# Patient Record
Sex: Male | Born: 1950 | Race: White | Hispanic: No | Marital: Married | Smoking: Never smoker
Health system: Southern US, Community
[De-identification: ages and names within clinical notes are randomized; demographics above are authoritative.]

## PROBLEM LIST (undated history)

## (undated) DIAGNOSIS — E119 Type 2 diabetes mellitus without complications: Secondary | ICD-10-CM

## (undated) DIAGNOSIS — G4733 Obstructive sleep apnea (adult) (pediatric): Secondary | ICD-10-CM

## (undated) DIAGNOSIS — C7B00 Secondary carcinoid tumors, unspecified site: Secondary | ICD-10-CM

## (undated) DIAGNOSIS — C787 Secondary malignant neoplasm of liver and intrahepatic bile duct: Secondary | ICD-10-CM

## (undated) DIAGNOSIS — I639 Cerebral infarction, unspecified: Secondary | ICD-10-CM

## (undated) DIAGNOSIS — I10 Essential (primary) hypertension: Secondary | ICD-10-CM

## (undated) HISTORY — PX: CHOLECYSTECTOMY: SHX55

---

## 2012-07-13 DIAGNOSIS — E785 Hyperlipidemia, unspecified: Secondary | ICD-10-CM | POA: Insufficient documentation

## 2012-07-13 DIAGNOSIS — I639 Cerebral infarction, unspecified: Secondary | ICD-10-CM | POA: Insufficient documentation

## 2014-08-07 DIAGNOSIS — D3A098 Benign carcinoid tumors of other sites: Secondary | ICD-10-CM | POA: Insufficient documentation

## 2014-10-17 ENCOUNTER — Other Ambulatory Visit: Payer: Self-pay | Admitting: Hematology and Oncology

## 2014-10-17 DIAGNOSIS — C228 Malignant neoplasm of liver, primary, unspecified as to type: Secondary | ICD-10-CM

## 2014-10-23 ENCOUNTER — Other Ambulatory Visit: Payer: Self-pay | Admitting: Hematology and Oncology

## 2014-10-23 DIAGNOSIS — C228 Malignant neoplasm of liver, primary, unspecified as to type: Secondary | ICD-10-CM

## 2014-10-23 DIAGNOSIS — C7A8 Other malignant neuroendocrine tumors: Secondary | ICD-10-CM

## 2014-10-24 ENCOUNTER — Ambulatory Visit
Admission: RE | Admit: 2014-10-24 | Discharge: 2014-10-24 | Disposition: A | Discharge status: Home / Self Care | Payer: Medicare Other | Source: Ambulatory Visit | Attending: Hematology and Oncology | Admitting: Hematology and Oncology

## 2014-10-24 DIAGNOSIS — C228 Malignant neoplasm of liver, primary, unspecified as to type: Secondary | ICD-10-CM | POA: Insufficient documentation

## 2014-10-24 DIAGNOSIS — C7A8 Other malignant neuroendocrine tumors: Secondary | ICD-10-CM | POA: Insufficient documentation

## 2014-10-24 HISTORY — DX: Secondary malignant neoplasm of liver and intrahepatic bile duct: C78.7

## 2014-10-24 HISTORY — DX: Essential (primary) hypertension: I10

## 2014-10-24 HISTORY — DX: Cerebral infarction, unspecified: I63.9

## 2014-10-24 NOTE — Consult Note (Signed)
Chief Complaint: Patient was seen in consultation today for  Chief Complaint  Patient presents with  . Advice Only    Liver metastasis/metastatic neuroendocrine carcinoid   at the request of Sanders,George H.  Referring Physician(s): Sanders,George H.  History of Present Illness: Gregory Cook is a 64 y.o. 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 His initial disease includes a solitary 12 mm right pulmonary nodule and a multifocal lesions in both the left and right liver.    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 This is currently relatively well controlled. He does describe intermittent flushing and shortness of breath but this does not seem to be as significant. He reports dyspnea on exertion and a constant feeling of pressure in his chest. He has undergone extensive workup for this including pulmonology and cardiology consultations. They have found no issues with his lungs or heart despite extensive workup.  Both 5 HIAA and ACTH levels are high. ACTH is 72.  Renal function and hepatic reserve are normal.  His most recent MRI demonstrates progression of hepatic metastatic disease.  There are 4 dominant liver lesions which are all enlarged. An additional 4 small lesions are also noted. His right upper lobe pulmonary nodule remains stable.   Past Medical History  Diagnosis Date  . Liver metastasis   . Stroke   . Hypertension     No past surgical history on file.  Allergies: Nuvigil  Medications: Prior to Admission medications   Medication Sig Start Date End Date Taking? Authorizing Provider  aspirin 81 MG tablet Take 81 mg by mouth daily.   Yes Historical Provider, MD  atenolol (TENORMIN) 50 MG tablet Take 50 mg by mouth daily.   Yes Historical Provider, MD    butalbital-acetaminophen-caffeine (FIORICET, ESGIC) 50-325-40 MG per tablet Take 1 tablet by mouth every 4 (four) hours as needed for headache.   Yes Historical Provider, MD  celecoxib (CELEBREX) 200 MG capsule Take 200 mg by mouth 2 (two) times daily.   Yes Historical Provider, MD  cholestyramine light (PREVALITE) 4 GM/DOSE powder Take 4 g by mouth 2 (two) times daily with a meal.   Yes Historical Provider, MD  diltiazem (DILACOR XR) 120 MG 24 hr capsule Take 120 mg by mouth daily.   Yes Historical Provider, MD  diphenoxylate-atropine (LOMOTIL) 2.5-0.025 MG per tablet Take 1 tablet by mouth 2 (two) times daily.   Yes Historical Provider, MD  famotidine (PEPCID) 10 MG tablet Take 10 mg by mouth 2 (two) times daily.   Yes Historical Provider, MD  HYDROcodone-acetaminophen (NORCO/VICODIN) 5-325 MG per tablet Take 1 tablet by mouth every 6 (six) hours as needed for moderate pain.   Yes Historical Provider, MD  octreotide (SANDOSTATIN LAR) 30 MG injection Inject 30 mg into the muscle every 28 (twenty-eight) days.   Yes Historical Provider, MD  Probiotic Product (PROBIOTIC DAILY PO) Take 1 capsule by mouth.   Yes Historical Provider, MD  rOPINIRole (REQUIP) 1 MG tablet Take 1 mg by mouth at bedtime.   Yes Historical Provider, MD  temazepam (RESTORIL) 15 MG capsule Take 15 mg by mouth at bedtime as needed for sleep.   Yes Historical Provider, MD  terazosin (HYTRIN) 10 MG capsule Take 10 mg by mouth at bedtime.   Yes Historical Provider, MD  testosterone cypionate (DEPOTESTOTERONE CYPIONATE) 100  MG/ML injection Inject 200 mg into the muscle every 14 (fourteen) days. For IM use only   Yes Historical Provider, MD  triamterene-hydrochlorothiazide (MAXZIDE) 75-50 MG per tablet Take 1 tablet by mouth daily.   Yes Historical Provider, MD     No family history on file.  History   Social History  . Marital Status: Married    Spouse Name: N/A  . Number of Children: N/A  . Years of Education: N/A   Social  History Main Topics  . Smoking status: Never Smoker   . Smokeless tobacco: Never Used  . Alcohol Use: No  . Drug Use: No  . Sexual Activity: Not on file   Other Topics Concern  . Not on file   Social History Narrative  . No narrative on file    ECOG Status: 1 - Symptomatic but completely ambulatory  Review of Systems: A 12 point ROS discussed and pertinent positives are indicated in the HPI above.  All other systems are negative.  Review of Systems  Vital Signs: BP 131/65 mmHg  Pulse 459  Temp(Src) 97.8 F (36.6 C) (Oral)  Resp 12  Ht 5\' 10"  (1.778 m)  Wt 272 lb (123.378 kg)  BMI 39.03 kg/m2  SpO2 98%  Physical Exam  Constitutional: He is oriented to person, place, and time. He appears well-developed and well-nourished. No distress.  HENT:  Head: Normocephalic and atraumatic.  Eyes: No scleral icterus.  Cardiovascular: Normal rate and regular rhythm.   Pulmonary/Chest: Effort normal.  Abdominal: Soft. He exhibits no distension and no mass. There is no tenderness.  Neurological: He is alert and oriented to person, place, and time.  Skin: Skin is warm and dry.  Psychiatric: He has a normal mood and affect. His behavior is normal.  Nursing note and vitals reviewed.   Mallampati Score:  2 Imaging: No results found.  Labs:  CBC: No results for input(s): WBC, HGB, HCT, PLT in the last 8760 hours.  COAGS: No results for input(s): INR, APTT in the last 8760 hours.  BMP: No results for input(s): NA, K, CL, CO2, GLUCOSE, BUN, CALCIUM, CREATININE, GFRNONAA, GFRAA in the last 8760 hours.  Invalid input(s): CMP  LIVER FUNCTION TESTS: No results for input(s): BILITOT, AST, ALT, ALKPHOS, PROT, ALBUMIN in the last 8760 hours.  TUMOR MARKERS: No results for input(s): AFPTM, CEA, CA199, CHROMGRNA in the last 8760 hours.  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 His solitary pulmonary nodule has remained  stable for the past 2 years while his hepatic disease has demonstrated progression on imaging surveillance despite Sandostatin therapy.  He has mild intermittent symptoms of carcinoid syndrome currently maintained on simvastatin 30 mg intramuscularly injected monthly.  There are 4 dominant lesions in the liver involving segments 4b, and 8/6. An additional 4 smaller lesions are present in segments 1, 3 and 6.  This gentleman is a candidate for liver directed therapy with bland embolization. Bland embolization has been shown to be equivalent to chemoembolization for the treatment of metastatic neuroendocrine tumor. If his disease progresses following bland embolization, the next step would be radioembolization with Y90.  90 radioembolization is also preferable in the setting of more significant multifocal disease.    Due to the overall number of lesions, he is not a good candidate for percutaneous thermal ablation.  The risks, benefits and alternatives to bland embolization were discussed at length with Mr. Kiernan and his wife.  He understands that he  may need several embolization sessions to cover all lesions and establish response to therapy. He also understands that if there is disease progression over the coming months or years, upgrade to radioembolization with Y90 may be required. They voiced their understanding and desire to proceed with this treatment.  1.) Will schedule for bland embolization to be performed at either [facility] or [facility] (first available for my schedule).  Mr. 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  2.) He will require pretreatment to prevent carcinoid crisis. The patient will receive 250 g Sandostatin subcutaneously on the day of the procedure. Additionally, another 500 g Sandostatin and 50 mL normal saline should be available during the procedure to be used as needed. He  will be admitted overnight for observation prior to discharge.  3.) We will keep an eye on his right pulmonary nodule. If this demonstrates any growth over time, this lesion would be easily amenable to percutaneous microwave ablation.  Thank you for this interesting consult.  I greatly enjoyed meeting Gregory Cook and look forward to participating in their care.  A copy of this report was sent to the requesting provider on this date.  SignedLaurence Ferrari, [place] 10/24/2014, 1:10 PM   I spent a total of  40 Minutes  in face to face in clinical consultation, greater than 50% of which was counseling/coordinating care for metastatic carcinoid tumor.

## 2014-11-06 ENCOUNTER — Other Ambulatory Visit: Payer: Self-pay | Admitting: Interventional Radiology

## 2014-11-06 ENCOUNTER — Other Ambulatory Visit: Payer: Self-pay | Admitting: *Deleted

## 2014-11-06 DIAGNOSIS — C7B8 Other secondary neuroendocrine tumors: Principal | ICD-10-CM

## 2014-11-06 DIAGNOSIS — C7A8 Other malignant neuroendocrine tumors: Secondary | ICD-10-CM

## 2014-11-23 ENCOUNTER — Ambulatory Visit
Admission: RE | Admit: 2014-11-23 | Discharge: 2014-11-23 | Disposition: A | Discharge status: Home / Self Care | Payer: Medicare Other | Source: Ambulatory Visit | Attending: Interventional Radiology | Admitting: Interventional Radiology

## 2014-11-23 DIAGNOSIS — C7B8 Other secondary neuroendocrine tumors: Secondary | ICD-10-CM | POA: Insufficient documentation

## 2014-11-23 NOTE — Progress Notes (Signed)
Chief Complaint: Patient was seen in consultation today for post bland embolization  Chief Complaint  Patient presents with  . Follow-up     3wk follow up Gregory Cook    at the request of Dr Gregory Cook  Referring Physician(s): Cook,Gregory  History of Present Illness:  Gregory Cook is a 64 y.o. 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 His initial disease includes a solitary 12 mm right pulmonary nodule and a multifocal lesions in both the left and right liver.   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  Bland Embolization of hepatic segment 4 was performed 11/03/14 at [facility] Overnight stay was uneventful and discharged following day. He comes today to clinic for follow up with Dr Gregory Cook. Pt states he did have expected moderate abdominal pain for approx 1 week following procedure then mild pain for additional week. Abdominal pain now is less than minimal ; occurring daily , lasting 1-2 hrs and resolves on own. Pain location is epigastric to Rt upper quadrant. Uses occasional Tylenol if anything at all. Diarrhea has really subsided quite a bit. Has noted that he now only has occassional bout and even then it is mild. Does take 1 Lomotil daily and only rarely uses Cholestyramine powder as needed. Has been able to return to most all daily activities  Labs drawn 11/13/14: all wnl No change in LFTs Sl increased wbc: 11 Cr wnl Glucose 200   Past Medical History  Diagnosis Date  . Liver metastasis   . Stroke   . Hypertension     No past surgical history on file.  Allergies: Nuvigil  Medications: Prior to Admission medications   Medication Sig Start Date End Date Taking? Authorizing Provider  aspirin 81 MG tablet Take 81 mg by mouth daily.   Yes  Historical Provider, MD  atenolol (TENORMIN) 50 MG tablet Take 50 mg by mouth daily.   Yes Historical Provider, MD  butalbital-acetaminophen-caffeine (FIORICET, ESGIC) 50-325-40 MG per tablet Take 1 tablet by mouth every 4 (four) hours as needed for headache.   Yes Historical Provider, MD  celecoxib (CELEBREX) 200 MG capsule Take 200 mg by mouth 2 (two) times daily.   Yes Historical Provider, MD  cholestyramine light (PREVALITE) 4 GM/DOSE powder Take 4 g by mouth 2 (two) times daily with a meal.   Yes Historical Provider, MD  diltiazem (DILACOR XR) 120 MG 24 hr capsule Take 120 mg by mouth daily.   Yes Historical Provider, MD  diphenoxylate-atropine (LOMOTIL) 2.5-0.025 MG per tablet Take 1 tablet by mouth 2 (two) times daily.   Yes Historical Provider, MD  famotidine (PEPCID) 10 MG tablet Take 10 mg by mouth 2 (two) times daily.   Yes Historical Provider, MD  HYDROcodone-acetaminophen (NORCO/VICODIN) 5-325 MG per tablet Take 1 tablet by mouth every 6 (six) hours as needed for moderate pain.   Yes Historical Provider, MD  octreotide (SANDOSTATIN LAR) 30 MG injection Inject 30 mg into the muscle every 28 (twenty-eight) days.   Yes Historical Provider, MD  Probiotic Product (PROBIOTIC DAILY PO) Take 1 capsule by mouth.   Yes Historical Provider, MD  rOPINIRole (REQUIP) 1 MG tablet Take 1 mg by mouth at bedtime.   Yes Historical Provider, MD  terazosin (HYTRIN) 10 MG capsule Take 10 mg by mouth at bedtime.   Yes Historical Provider, MD  testosterone cypionate (DEPOTESTOTERONE CYPIONATE) 100 MG/ML injection Inject 200 mg into the muscle every 14 (fourteen) days. For IM use only   Yes Historical Provider, MD  triamterene-hydrochlorothiazide (MAXZIDE) 75-50 MG per tablet Take 1 tablet by mouth daily.   Yes Historical Provider, MD  temazepam (RESTORIL) 15 MG capsule Take 15 mg by mouth at bedtime as needed for sleep.    Historical Provider, MD     No family history on file.  Social History   Social  History  . Marital Status: Married    Spouse Name: N/A  . Number of Children: N/A  . Years of Education: N/A   Social History Main Topics  . Smoking status: Never Smoker   . Smokeless tobacco: Never Used  . Alcohol Use: No  . Drug Use: No  . Sexual Activity: Not on file   Other Topics Concern  . Not on file   Social History Narrative  . No narrative on file    Review of Systems: A 12 point ROS discussed and pertinent positives are indicated in the HPI above.  All other systems are negative.  Review of Systems  Constitutional: Negative for activity change, appetite change and fatigue.  Gastrointestinal: Positive for abdominal pain and diarrhea.  Neurological: Negative for weakness.  Psychiatric/Behavioral: Negative for behavioral problems and confusion.    Vital Signs: BP 135/71 mmHg  Pulse 61  Temp(Src) 98.1 F (36.7 C) (Oral)  Resp 14  SpO2 99%  Physical Exam  Constitutional: He is oriented to person, place, and time. He appears well-nourished.  Cardiovascular: Normal rate, regular rhythm and normal heart sounds.   No murmur heard. Pulmonary/Chest: Effort normal and breath sounds normal. He has no wheezes.  Abdominal: Soft. Bowel sounds are normal. There is tenderness.  Mild RUQ to mid epigastric pain to deep palpation  Musculoskeletal: Normal range of motion.  Neurological: He is alert and oriented to person, place, and time.  Skin: Skin is warm and dry.  Psychiatric: He has a normal mood and affect. His behavior is normal. Judgment and thought content normal.  Nursing note and vitals reviewed.   Mallampati Score:     Imaging: No results found.  Labs:  CBC: No results for input(s): WBC, HGB, HCT, PLT in the last 8760 hours.  COAGS: No results for input(s): INR, APTT in the last 8760 hours.  BMP: No results for input(s): NA, K, CL, CO2, GLUCOSE, BUN, CALCIUM, CREATININE, GFRNONAA, GFRAA in the last 8760 hours.  Invalid input(s): CMP  LIVER  FUNCTION TESTS: No results for input(s): BILITOT, AST, ALT, ALKPHOS, PROT, ALBUMIN in the last 8760 hours.  TUMOR MARKERS: No results for input(s): AFPTM, CEA, CA199, CHROMGRNA in the last 8760 hours.  Assessment:  Neuroendocrine cancer Metastasis to liver L hepatic segment 4 bland embolization performed 11/03/14 in [facility] with Dr Gregory Cook Imaging of procedure has been reviewed with pt and wife per Dr Gregory Cook All questions answered to satisfaction Has done well at follow up Plan: Rt sided bland embolization scheduled at [place] 12/07/14 with Dr Gregory Cook Pt and wife agreeable to plan.   SignedMonia Sabal A 11/23/2014, 8:54 AM   Please refer to Dr. Laurence Cook attestation of this note for management and plan.

## 2014-12-08 ENCOUNTER — Other Ambulatory Visit: Payer: Self-pay | Admitting: Radiology

## 2014-12-08 DIAGNOSIS — C7B8 Other secondary neuroendocrine tumors: Secondary | ICD-10-CM

## 2014-12-26 ENCOUNTER — Other Ambulatory Visit: Payer: Self-pay | Admitting: *Deleted

## 2014-12-26 ENCOUNTER — Ambulatory Visit
Admission: RE | Admit: 2014-12-26 | Discharge: 2014-12-26 | Disposition: A | Discharge status: Home / Self Care | Payer: Medicare Other | Source: Ambulatory Visit | Attending: Radiology | Admitting: Radiology

## 2014-12-26 DIAGNOSIS — C7B8 Other secondary neuroendocrine tumors: Secondary | ICD-10-CM

## 2014-12-26 DIAGNOSIS — C7A8 Other malignant neuroendocrine tumors: Secondary | ICD-10-CM

## 2014-12-26 NOTE — Progress Notes (Signed)
Chief Complaint: Patient was seen in consultation today for  Chief Complaint  Patient presents with  . Follow-up    follow up Gregory Cook     at the request of Dr. Verner Chol  Referring Physician(s): Verner Chol  History of Present Illness: Gregory Cook is a 64 y.o. 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 His initial disease includes a solitary 12 mm right pulmonary nodule and a multifocal lesions in both the left and right liver.   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 This is currently relatively well controlled. He does describe intermittent flushing and shortness of breath but this does not seem to be as significant. He reports dyspnea on exertion and a constant feeling of pressure in his chest. He has undergone extensive workup for this including pulmonology and cardiology consultations. They have found no issues with his lungs or heart despite extensive workup.  He underwent first Bland embolization of his dominant segment 4 lesion on 11/03/2014 followed by [facility] embolization of 2 additional lesions on 12/07/2014.  Of note, his lesions are minimally hypervascular and anatomic segmental embolization has to be performed to target these lesions. Therefore, the small lesion in the caudate lobe was not targeted.  He presents today for his 2 week follow-up evaluation following his second embolization. Following that embolization he had an exacerbation of his carcinoid syndrome symptoms including an increase in diarrhea as well as intermittent episodes of flushing and redness. His diarrhea has since calmed down and has returned to baseline while his intermittent flushing episodes persist and occur daily. He finds these to be a mild annoyance but not necessarily a  problem.  Past Medical History  Diagnosis Date  . Liver metastasis   . Stroke   . Hypertension     No past surgical history on file.  Allergies: Nuvigil  Medications: Prior to Admission medications   Medication Sig Start Date End Date Taking? Authorizing Provider  aspirin 81 MG tablet Take 81 mg by mouth daily.   Yes Historical Provider, MD  atenolol (TENORMIN) 50 MG tablet Take 50 mg by mouth daily.   Yes Historical Provider, MD  celecoxib (CELEBREX) 200 MG capsule Take 200 mg by mouth 2 (two) times daily.   Yes Historical Provider, MD  diltiazem (DILACOR XR) 120 MG 24 hr capsule Take 120 mg by mouth daily.   Yes Historical Provider, MD  diphenoxylate-atropine (LOMOTIL) 2.5-0.025 MG per tablet Take 1 tablet by mouth 2 (two) times daily.   Yes Historical Provider, MD  famotidine (PEPCID) 10 MG tablet Take 10 mg by mouth 2 (two) times daily.   Yes Historical Provider, MD  octreotide (SANDOSTATIN LAR) 30 MG injection Inject 30 mg into the muscle every 28 (twenty-eight) days.   Yes Historical Provider, MD  Probiotic Product (PROBIOTIC DAILY PO) Take 1 capsule by mouth.   Yes Historical Provider, MD  rOPINIRole (REQUIP) 1 MG tablet Take 1 mg by mouth at bedtime.   Yes Historical Provider, MD  terazosin (HYTRIN) 10 MG capsule Take 10 mg by mouth at bedtime.   Yes Historical Provider, MD  testosterone cypionate (DEPOTESTOTERONE CYPIONATE) 100 MG/ML injection Inject 200 mg into the muscle every 14 (fourteen) days. For IM use only   Yes Historical Provider, MD  triamterene-hydrochlorothiazide (MAXZIDE) 75-50 MG per tablet Take 1 tablet by mouth daily.  Yes Historical Provider, MD  butalbital-acetaminophen-caffeine (FIORICET, ESGIC) 50-325-40 MG per tablet Take 1 tablet by mouth every 4 (four) hours as needed for headache.    Historical Provider, MD  cholestyramine light (PREVALITE) 4 GM/DOSE powder Take 4 g by mouth 2 (two) times daily with a meal.    Historical Provider, MD   HYDROcodone-acetaminophen (NORCO/VICODIN) 5-325 MG per tablet Take 1 tablet by mouth every 6 (six) hours as needed for moderate pain.    Historical Provider, MD  temazepam (RESTORIL) 15 MG capsule Take 15 mg by mouth at bedtime as needed for sleep.    Historical Provider, MD     No family history on file.  Social History   Social History  . Marital Status: Married    Spouse Name: N/A  . Number of Children: N/A  . Years of Education: N/A   Social History Main Topics  . Smoking status: Never Smoker   . Smokeless tobacco: Never Used  . Alcohol Use: No  . Drug Use: No  . Sexual Activity: Not on file   Other Topics Concern  . Not on file   Social History Narrative  . No narrative on file    ECOG Status: 1 - Symptomatic but completely ambulatory  Review of Systems: A 12 point ROS discussed and pertinent positives are indicated in the HPI above.  All other systems are negative.  Review of Systems  Vital Signs: BP 128/72 mmHg  Pulse 52  Temp(Src) 97.8 F (36.6 C) (Oral)  Resp 14  SpO2 98%  Physical Exam  Constitutional: He is oriented to person, place, and time. He appears well-developed and well-nourished. No distress.  HENT:  Head: Normocephalic and atraumatic.  Eyes: No scleral icterus.  Cardiovascular: Normal rate and regular rhythm.   Pulmonary/Chest: Effort normal and breath sounds normal.  Abdominal: Soft. He exhibits no distension. There is no tenderness.  Neurological: He is alert and oriented to person, place, and time.  Skin: Skin is warm and dry.  Psychiatric: He has a normal mood and affect. His behavior is normal.  Nursing note and vitals reviewed.   Mallampati Score:  2 Imaging: No results found.   Assessment and Plan:  Gregory Cook had some exacerbation of his carcinoid syndrome like symptoms after his last embolization on 12/07/2014. His diarrhea has normalized and is now well controlled on Lomotil again. He continues to have intermittent  symptoms of flushing.  I am hopeful that this is indicative of a good treatment response with a release of serotonin and its derivatives secondary to ischemia and the treated tumors.  Otherwise, he is doing quite well.  1.) MRI with Gadolinium contrast in Mid November (3 months post initial treatment) to be followed with a clinic visit.  Please schedule MRI to be done at [facility].     - We will decide at that time whether additional bland embolization or escalation of treatment to Y 90 radioembolization need be considered.  SignedJacqulynn Cadet 12/26/2014, 10:47 AM   I spent a total of  15 Minutes in face to face in clinical consultation, greater than 50% of which was counseling/coordinating care for metastatic carcinoid with carcinoid syndrome.

## 2014-12-27 ENCOUNTER — Other Ambulatory Visit: Payer: Self-pay | Admitting: Interventional Radiology

## 2014-12-27 DIAGNOSIS — C7A8 Other malignant neuroendocrine tumors: Secondary | ICD-10-CM

## 2015-02-13 DIAGNOSIS — R0789 Other chest pain: Secondary | ICD-10-CM | POA: Insufficient documentation

## 2015-02-13 DIAGNOSIS — R0602 Shortness of breath: Secondary | ICD-10-CM | POA: Insufficient documentation

## 2015-02-13 DIAGNOSIS — I10 Essential (primary) hypertension: Secondary | ICD-10-CM | POA: Insufficient documentation

## 2015-02-13 DIAGNOSIS — R200 Anesthesia of skin: Secondary | ICD-10-CM | POA: Insufficient documentation

## 2015-02-13 DIAGNOSIS — M1990 Unspecified osteoarthritis, unspecified site: Secondary | ICD-10-CM | POA: Insufficient documentation

## 2015-02-13 DIAGNOSIS — G4730 Sleep apnea, unspecified: Secondary | ICD-10-CM | POA: Insufficient documentation

## 2015-02-13 DIAGNOSIS — R000 Tachycardia, unspecified: Secondary | ICD-10-CM | POA: Insufficient documentation

## 2015-02-13 DIAGNOSIS — R197 Diarrhea, unspecified: Secondary | ICD-10-CM | POA: Insufficient documentation

## 2015-02-14 ENCOUNTER — Ambulatory Visit
Admission: RE | Admit: 2015-02-14 | Discharge: 2015-02-14 | Disposition: A | Discharge status: Home / Self Care | Payer: Medicare Other | Source: Ambulatory Visit | Attending: Interventional Radiology | Admitting: Interventional Radiology

## 2015-02-14 DIAGNOSIS — C7A8 Other malignant neuroendocrine tumors: Secondary | ICD-10-CM

## 2015-02-14 NOTE — Progress Notes (Signed)
Referring Physician(s): Dr Verner Chol   Chief Complaint: The patient is seen in follow up today s/p Bland embolizations of liver lesions 11/03/2014 and 12/07/2014 with Dr Laurence Ferrari  History of present illness:  Gregory Cook is a 64 y.o. 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 His initial disease includes a solitary 12 mm right pulmonary nodule and a multifocal lesions in both the left and right liver.  He underwent first Bland embolization of his dominant segment 4 lesion on 11/03/2014 followed by [facility] embolization of 2 additional lesions on 12/07/2014. Of note, his lesions are minimally hypervascular and anatomic segmental embolization has to be performed to target these lesions. Therefore, the small lesion in the caudate lobe was not targeted.  Presents today after 3 month MRI (post original treatment). MRI performed 02/12/15 He states he is really doing quite well. Diarrhea still an issue but has learned how to control this. Taking 2 Lomotil /day x last 4 days with additional Prevalite as needed Commonly can control diarrhea with 1 Lomotil daily---occasionally diarrhea flairs. Denies flushing or redness of skin. Denies Nausea or abd pain Denies fever Has gained 1 lb per primary MD. Eating and sleeping well   Past Medical History  Diagnosis Date  . Liver metastasis ([place])   . Stroke ([place])   . Hypertension     No past surgical history on file.  Allergies: Nuvigil  Medications: Prior to Admission medications   Medication Sig Start Date End Date Taking? Authorizing Provider  acetaminophen (TYLENOL) 500 MG tablet Take 1,000 mg by mouth every 6 (six) hours as needed.   Yes Historical Provider, MD  aspirin 81 MG tablet Take 81 mg by mouth daily.   Yes Historical Provider, MD  atenolol (TENORMIN) 50 MG tablet Take 50 mg  by mouth daily.   Yes Historical Provider, MD  butalbital-acetaminophen-caffeine (FIORICET, ESGIC) 50-325-40 MG per tablet Take 1 tablet by mouth every 4 (four) hours as needed for headache.   Yes Historical Provider, MD  celecoxib (CELEBREX) 200 MG capsule Take 200 mg by mouth 2 (two) times daily.   Yes Historical Provider, MD  cholestyramine light (PREVALITE) 4 GM/DOSE powder Take 4 g by mouth 2 (two) times daily with a meal.   Yes Historical Provider, MD  diltiazem (DILACOR XR) 120 MG 24 hr capsule Take 120 mg by mouth daily.   Yes Historical Provider, MD  diphenoxylate-atropine (LOMOTIL) 2.5-0.025 MG per tablet Take 1 tablet by mouth 2 (two) times daily.   Yes Historical Provider, MD  octreotide (SANDOSTATIN LAR) 30 MG injection Inject 30 mg into the muscle every 28 (twenty-eight) days.   Yes Historical Provider, MD  Probiotic Product (PROBIOTIC DAILY PO) Take 1 capsule by mouth.   Yes Historical Provider, MD  rOPINIRole (REQUIP) 1 MG tablet Take 1 mg by mouth at bedtime.   Yes Historical Provider, MD  temazepam (RESTORIL) 15 MG capsule Take 15 mg by mouth at bedtime as needed for sleep.   Yes Historical Provider, MD  terazosin (HYTRIN) 10 MG capsule Take 10 mg by mouth at bedtime.   Yes Historical Provider, MD  testosterone cypionate (DEPOTESTOTERONE CYPIONATE) 100 MG/ML injection Inject 200 mg into the muscle every 14 (fourteen) days. For IM use only   Yes Historical Provider, MD  triamterene-hydrochlorothiazide (MAXZIDE) 75-50 MG per tablet Take 1 tablet by mouth daily.   Yes Historical Provider, MD  No family history on file.  Social History   Social History  . Marital Status: Married    Spouse Name: N/A  . Number of Children: N/A  . Years of Education: N/A   Social History Main Topics  . Smoking status: Never Smoker   . Smokeless tobacco: Never Used  . Alcohol Use: No  . Drug Use: No  . Sexual Activity: Not Asked   Other Topics Concern  . None   Social History Narrative      Vital Signs: BP 144/65 mmHg  Pulse 85  Temp(Src) 98.2 F (36.8 C)  Resp 18  SpO2 95%  Physical Exam  Constitutional: He is oriented to person, place, and time. He appears well-nourished.  Cardiovascular: Normal rate, regular rhythm and normal heart sounds.   Pulmonary/Chest: Effort normal and breath sounds normal. He has no wheezes.  Abdominal: Soft. Bowel sounds are normal. He exhibits no distension. There is no tenderness.  Musculoskeletal: Normal range of motion.  Neurological: He is alert and oriented to person, place, and time.  Skin: Skin is warm and dry.  Psychiatric: He has a normal mood and affect. His behavior is normal. Judgment and thought content normal.  Nursing note and vitals reviewed.   Imaging: No results found.  Labs:  CBC: No results for input(s): WBC, HGB, HCT, PLT in the last 8760 hours.  COAGS: No results for input(s): INR, APTT in the last 8760 hours.  BMP: No results for input(s): NA, K, CL, CO2, GLUCOSE, BUN, CALCIUM, CREATININE, GFRNONAA, GFRAA in the last 8760 hours.  Invalid input(s): CMP  LIVER FUNCTION TESTS: No results for input(s): BILITOT, AST, ALT, ALKPHOS, PROT, ALBUMIN in the last 8760 hours.  Assessment:  Neuroendocrine carcinoma Liver metastasis Bland embolization x 2 (10/2014 and 11/2014) Doing well Diarrhea controllable MRI 11/21 reveals: 1. Clear interval decrease in size of larger lesions in Left and Right hepatic                                        lobes following bland embolization.                                  2. Smaller hepatic lesions are stable to mildly decreased in size.                                  3. No evidence of new hepatic lesions identified. Dr Laurence Ferrari has reviewed imaging with pt and wife. He has answered all questions to satisfaction. Plan for 3 mo MRI and revisit with Dr Laurence Ferrari   Signed: Monia Sabal A 02/14/2015, 8:34 AM   Please refer to Dr. Laurence Ferrari attestation of this  note for management and plan.

## 2015-02-14 NOTE — Progress Notes (Signed)
Patient states he has been doing well since his bland embolizations 11/03/14 and 12/07/14. His only complaint is of continued and worsening diarrhea.  He continues to take Lomotil and Prevalite (Cholestyramine), the latter relieving the diarrhea better.  He states he usually waits until experiencing two episodes before taking medication.  jkl

## 2015-03-08 DIAGNOSIS — C7B02 Secondary carcinoid tumors of liver: Secondary | ICD-10-CM | POA: Insufficient documentation

## 2015-04-03 DIAGNOSIS — IMO0002 Reserved for concepts with insufficient information to code with codable children: Secondary | ICD-10-CM | POA: Insufficient documentation

## 2015-04-03 DIAGNOSIS — I6930 Unspecified sequelae of cerebral infarction: Secondary | ICD-10-CM | POA: Insufficient documentation

## 2015-04-03 DIAGNOSIS — I10 Essential (primary) hypertension: Secondary | ICD-10-CM | POA: Insufficient documentation

## 2015-04-03 DIAGNOSIS — G44209 Tension-type headache, unspecified, not intractable: Secondary | ICD-10-CM | POA: Insufficient documentation

## 2015-04-03 DIAGNOSIS — E119 Type 2 diabetes mellitus without complications: Secondary | ICD-10-CM | POA: Insufficient documentation

## 2015-04-03 DIAGNOSIS — G2581 Restless legs syndrome: Secondary | ICD-10-CM | POA: Insufficient documentation

## 2015-04-03 DIAGNOSIS — K219 Gastro-esophageal reflux disease without esophagitis: Secondary | ICD-10-CM | POA: Insufficient documentation

## 2015-04-03 DIAGNOSIS — E291 Testicular hypofunction: Secondary | ICD-10-CM | POA: Insufficient documentation

## 2015-04-03 DIAGNOSIS — M12569 Traumatic arthropathy, unspecified knee: Secondary | ICD-10-CM | POA: Insufficient documentation

## 2015-04-03 DIAGNOSIS — C7B09 Secondary carcinoid tumors of other sites: Secondary | ICD-10-CM | POA: Insufficient documentation

## 2015-04-12 ENCOUNTER — Other Ambulatory Visit: Payer: Self-pay | Admitting: Radiology

## 2015-04-12 DIAGNOSIS — C7B02 Secondary carcinoid tumors of liver: Secondary | ICD-10-CM

## 2015-04-19 ENCOUNTER — Other Ambulatory Visit: Payer: Self-pay | Admitting: Interventional Radiology

## 2015-04-19 DIAGNOSIS — C7A8 Other malignant neuroendocrine tumors: Secondary | ICD-10-CM

## 2015-04-21 DIAGNOSIS — M75122 Complete rotator cuff tear or rupture of left shoulder, not specified as traumatic: Secondary | ICD-10-CM | POA: Insufficient documentation

## 2015-05-15 ENCOUNTER — Ambulatory Visit
Admission: RE | Admit: 2015-05-15 | Discharge: 2015-05-15 | Disposition: A | Discharge status: Home / Self Care | Payer: Medicare Other | Source: Ambulatory Visit | Attending: Interventional Radiology | Admitting: Interventional Radiology

## 2015-05-15 DIAGNOSIS — C7A8 Other malignant neuroendocrine tumors: Secondary | ICD-10-CM

## 2015-05-15 NOTE — Progress Notes (Addendum)
Chief Complaint: Patient was seen in consultation today for metastatic neuroendocrine tumor at the request of Verner Chol.  Referring Physician(s): Verner Chol  History of Present Illness: Gregory Cook is a 65 y.o. 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 His initial disease includes a solitary 12 mm right pulmonary nodule and a multifocal lesions in both the left and right liver.  He underwent first Bland embolization of his dominant segment 4 lesion on 11/03/2014 followed by [facility] embolization of 2 additional lesions on 12/07/2014. Of note, his lesions are minimally hypervascular and anatomic segmental embolization has to be performed to target these lesions. Therefore, the small lesion in the caudate lobe was not targeted.  His first follow-up MRI on 02/12/2015 demonstrated a significant treatment response. His second follow-up MRI which was performed earlier this month on 05/02/2015 demonstrates continued treatment response with further decrease in size of the previously treated lesions. The untreated caudate lobe lesion remains stable.  Gregory Cook continues to do well. He has persistent mild diarrhea which is well controlled with Lomotil and Prevalite. He continues to deny flushing, skin redness, nausea, headaches or other symptoms.  On review of systems he endorses left shoulder pain. 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 He has a tentative plan to undergo surgical repair of the left shoulder in the near future.   Past Medical History  Diagnosis Date  . Liver metastasis ([place])   . Stroke ([place])   . Hypertension     No past surgical history on file.  Allergies: Nuvigil  Medications: Prior to Admission medications   Medication Sig Start Date End Date Taking? Authorizing Provider  acetaminophen  (TYLENOL) 500 MG tablet Take 1,000 mg by mouth every 6 (six) hours as needed.    Historical Provider, MD  aspirin 81 MG tablet Take 81 mg by mouth daily.    Historical Provider, MD  atenolol (TENORMIN) 50 MG tablet Take 50 mg by mouth daily.    Historical Provider, MD  butalbital-acetaminophen-caffeine (FIORICET, ESGIC) 50-325-40 MG per tablet Take 1 tablet by mouth every 4 (four) hours as needed for headache.    Historical Provider, MD  celecoxib (CELEBREX) 200 MG capsule Take 200 mg by mouth 2 (two) times daily.    Historical Provider, MD  cholestyramine light (PREVALITE) 4 GM/DOSE powder Take 4 g by mouth 2 (two) times daily with a meal.    Historical Provider, MD  diltiazem (DILACOR XR) 120 MG 24 hr capsule Take 120 mg by mouth daily.    Historical Provider, MD  diphenoxylate-atropine (LOMOTIL) 2.5-0.025 MG per tablet Take 1 tablet by mouth 2 (two) times daily.    Historical Provider, MD  famotidine (PEPCID) 10 MG tablet Take by mouth.    Historical Provider, MD  octreotide (SANDOSTATIN LAR) 30 MG injection Inject 30 mg into the muscle every 28 (twenty-eight) days.    Historical Provider, MD  rOPINIRole (REQUIP) 1 MG tablet Take 1 mg by mouth at bedtime.    Historical Provider, MD  terazosin (HYTRIN) 10 MG capsule Take 10 mg by mouth at bedtime.    Historical Provider, MD  testosterone cypionate (DEPOTESTOTERONE CYPIONATE) 100 MG/ML injection Inject 200 mg into the muscle every 14 (fourteen) days. For IM use only    Historical Provider, MD  triamterene-hydrochlorothiazide (MAXZIDE) 75-50 MG per tablet Take 1 tablet by mouth daily.    Historical Provider, MD  No family history on file.  Social History   Social History  . Marital Status: Married    Spouse Name: N/A  . Number of Children: N/A  . Years of Education: N/A   Social History Main Topics  . Smoking status: Never Smoker   . Smokeless tobacco: Never Used  . Alcohol Use: No  . Drug Use: No  . Sexual Activity: Not on file    Other Topics Concern  . Not on file   Social History Narrative    ECOG Status: 1 - Symptomatic but completely ambulatory  Review of Systems: A 12 point ROS discussed and pertinent positives are indicated in the HPI above.  All other systems are negative.  Review of Systems  Vital Signs: BP 132/73 mmHg  Pulse 69  Temp(Src) 97.7 F (36.5 C)  Resp 20  SpO2 97%  Physical Exam  Constitutional: He is oriented to person, place, and time. He appears well-developed and well-nourished.  HENT:  Head: Normocephalic and atraumatic.  Eyes: No scleral icterus.  Cardiovascular: Normal rate.   Pulmonary/Chest: Effort normal.  Abdominal: Soft. He exhibits no distension and no mass.  Neurological: He is alert and oriented to person, place, and time.  Skin: Skin is warm and dry.  Numerous actinic keratoses.  Psychiatric: He has a normal mood and affect. His behavior is normal.  Vitals reviewed.    Imaging: No results found.  Labs:  CBC: No results for input(s): WBC, HGB, HCT, PLT in the last 8760 hours.  COAGS: No results for input(s): INR, APTT in the last 8760 hours.  BMP: No results for input(s): NA, K, CL, CO2, GLUCOSE, BUN, CALCIUM, CREATININE, GFRNONAA, GFRAA in the last 8760 hours.  Invalid input(s): CMP  LIVER FUNCTION TESTS: No results for input(s): BILITOT, AST, ALT, ALKPHOS, PROT, ALBUMIN in the last 8760 hours.  TUMOR MARKERS: No results for input(s): AFPTM, CEA, CA199, CHROMGRNA in the last 8760 hours.  Assessment and Plan:  Gregory Cook continues to do very well status post bland embolization of his metastatic neuroendocrine tumors. He continues to have some issues with mild carcinoid syndrome but his diarrhea is well controlled medically.  I reviewed his MRI which was performed earlier this month. His previously treated lesions continue to slightly decrease in size consistent with treatment effect. In particular, the lesion in segment 4b has significantly  decreased in size and appears avascular consistent with a complete response for this particular tumor site. The other treated lesions are decreased in size but remain vascular consistent with partial response. The untreated caudate lesion remains stable.  Given his partial response to therapy and otherwise stable disease, my recommendation is for continued observation at this time. If he develops increasing clinical symptoms which are no longer easily medically controlled, or evidence of increasing tumor burden then I would proceed with repeat bland embolization, or Y 90.  1.) repeat MRI with gadolinium contrast in 3 months followed by return to interventional radiology clinic.   Electronically Signed: [name] 05/15/2015, 9:06 AM   I spent a total of  15 Minutes in face to face in clinical consultation, greater than 50% of which was counseling/coordinating care for neuroendocrine tumor metastasized to liver with carcinoid syndrome.

## 2015-07-18 ENCOUNTER — Other Ambulatory Visit: Payer: Self-pay | Admitting: Radiology

## 2015-07-19 ENCOUNTER — Telehealth: Payer: Self-pay | Admitting: Radiology

## 2015-07-19 NOTE — Telephone Encounter (Signed)
Called patient per Dr Laurence Ferrari re:  MR of 07/18/2015.  Dr Laurence Ferrari reviewed MR Abd w/ & w/o Contrast, Labs, office notes from Dr Baird Cancer, etc.  MR shows that liver mets are stable.  Patient's next follow up with Dr. Verner Chol is scheduled for 08/07/2015.   Patient instructed to call for concerns or problems.  Next planned follow up to be scheduled for October 2017 (MR and appointment w/ Dr Laurence Ferrari).  Patient is in agreement.    Raejean Swinford Riki Rusk, RN 07/19/2015 1:25 PM

## 2016-03-26 ENCOUNTER — Other Ambulatory Visit (HOSPITAL_COMMUNITY): Payer: Self-pay | Admitting: Interventional Radiology

## 2016-03-26 DIAGNOSIS — C7B8 Other secondary neuroendocrine tumors: Principal | ICD-10-CM

## 2016-03-26 DIAGNOSIS — C7A8 Other malignant neuroendocrine tumors: Secondary | ICD-10-CM

## 2016-05-07 ENCOUNTER — Ambulatory Visit
Admission: RE | Admit: 2016-05-07 | Discharge: 2016-05-07 | Disposition: A | Discharge status: Home / Self Care | Payer: Medicare Other | Source: Ambulatory Visit | Attending: Interventional Radiology | Admitting: Interventional Radiology

## 2016-05-07 ENCOUNTER — Encounter: Payer: Self-pay | Admitting: Radiology

## 2016-05-07 DIAGNOSIS — C7B8 Other secondary neuroendocrine tumors: Principal | ICD-10-CM

## 2016-05-07 DIAGNOSIS — C7A8 Other malignant neuroendocrine tumors: Secondary | ICD-10-CM

## 2016-05-07 HISTORY — PX: IR GENERIC HISTORICAL: IMG1180011

## 2016-05-07 NOTE — Progress Notes (Signed)
Chief Complaint: Patient was seen in consultation today for  Chief Complaint  Patient presents with  . Follow-up    18 mo follow up Hepatic Embolization (Left Lobe 12/07/2014; Right lobe 11/03/2014   at the request of Jamylah Marinaccio  Referring Physician(s): Verner Chol  History of Present Illness: Gregory Cook is a 66 y.o. 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 His initial disease includes a solitary 12 mm right pulmonary nodule and a multifocal lesions in both the left and right liver.  He underwent first Bland embolization of his dominant segment 4 lesion on 11/03/2014 followed by [facility] embolization of 2 additional lesions on 12/07/2014. Of note, his lesions are minimally hypervascular and anatomic segmental embolization had to be performed to target these lesions. Therefore, the small lesion in the caudate lobe was not targeted.  His first follow-up MRI on 02/12/2015 demonstrated a significant treatment response. Subsequent follow-up scans on 05/02/2015 and in August 2017 also demonstrated continued shrinkage of the treated lesions. His most recent study performed in November 2017 demonstrates stable disease without evidence of new enhancing tissue.  Gregory Cook continues to do well. He has persistent mild diarrhea which is well controlled with Lomotil and Prevalite. He continues to deny flushing, skin redness, nausea, headaches or other symptoms.  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 Additionally, he has undergone topical therapy for innumerable actinic keratoses on the face and arms. He is currently recovering from the posttreatment rash on his face.  He is being evaluated by his primary care physician for fatigue. He has low testosterone and is receiving  testosterone supplementation. They plan on trying lab work soon.  Past Medical History:  Diagnosis Date  . Hypertension   . Liver metastasis ([place])   . Stroke [facility])     Past Surgical History:  Procedure Laterality Date  . IR GENERIC HISTORICAL  05/07/2016   IR RADIOLOGIST EVAL & MGMT 05/07/2016 [name], MD [facility] INTERV RAD    Allergies: Nuvigil [armodafinil]  Medications: Prior to Admission medications   Medication Sig Start Date End Date Taking? Authorizing Provider  acetaminophen (TYLENOL) 500 MG tablet Take 1,000 mg by mouth every 6 (six) hours as needed.   Yes Historical Provider, MD  aspirin 81 MG tablet Take 81 mg by mouth daily.   Yes Historical Provider, MD  atenolol (TENORMIN) 50 MG tablet Take 50 mg by mouth daily.   Yes Historical Provider, MD  atorvastatin (LIPITOR) 40 MG tablet Take 40 mg by mouth daily.   Yes Historical Provider, MD  butalbital-acetaminophen-caffeine (FIORICET, ESGIC) 50-325-40 MG per tablet Take 1 tablet by mouth every 4 (four) hours as needed for headache.   Yes Historical Provider, MD  cholestyramine light (PREVALITE) 4 GM/DOSE powder Take 4 g by mouth 2 (two) times daily with a meal.   Yes Historical Provider, MD  diclofenac sodium (VOLTAREN) 1 % GEL Apply topically 4 (four) times daily as needed.   Yes Historical Provider, MD  diphenoxylate-atropine (LOMOTIL) 2.5-0.025 MG per tablet Take 1 tablet by mouth 2 (two) times daily.   Yes Historical Provider, MD  famotidine (PEPCID) 10 MG tablet Take by mouth.   Yes Historical Provider, MD  octreotide (SANDOSTATIN LAR) 30 MG injection Inject 30 mg into the muscle every 28 (twenty-eight) days.   Yes Historical Provider, MD  rOPINIRole (REQUIP)  1 MG tablet Take 1 mg by mouth at bedtime.   Yes Historical Provider, MD  terazosin (HYTRIN) 10 MG capsule Take 10 mg by mouth at bedtime.   Yes Historical Provider, MD  testosterone cypionate (DEPOTESTOTERONE CYPIONATE) 100 MG/ML injection Inject 200 mg into  the muscle every 14 (fourteen) days. For IM use only   Yes Historical Provider, MD  triamterene-hydrochlorothiazide (MAXZIDE) 75-50 MG per tablet Take 1 tablet by mouth daily.   Yes Historical Provider, MD  celecoxib (CELEBREX) 200 MG capsule Take 200 mg by mouth 2 (two) times daily.    Historical Provider, MD  diltiazem (DILACOR XR) 120 MG 24 hr capsule Take 120 mg by mouth daily.    Historical Provider, MD     No family history on file.  Social History   Social History  . Marital status: Married    Spouse name: N/A  . Number of children: N/A  . Years of education: N/A   Social History Main Topics  . Smoking status: Never Smoker  . Smokeless tobacco: Never Used  . Alcohol use No  . Drug use: No  . Sexual activity: Not on file   Other Topics Concern  . Not on file   Social History Narrative  . No narrative on file    ECOG Status: 1 - Symptomatic but completely ambulatory  Review of Systems: A 12 point ROS discussed and pertinent positives are indicated in the HPI above.  All other systems are negative.  Review of Systems  Vital Signs: BP 123/74 (BP Location: Right Arm, Patient Position: Sitting, Cuff Size: Large)   Pulse (!) 56   Temp 98.2 F (36.8 C) (Oral)   Resp 16   Ht 5\' 10"  (1.778 m)   Wt 257 lb (116.6 kg)   SpO2 96%   BMI 36.88 kg/m   Physical Exam  Constitutional: He appears well-developed and well-nourished. No distress.  HENT:  Head: Normocephalic and atraumatic.  Eyes: No scleral icterus.  Cardiovascular: Normal rate and regular rhythm.   Pulmonary/Chest: Effort normal.  Abdominal: Soft. He exhibits no distension. There is no tenderness.  Skin:  Patchy erythematous rash covers the face,  Innumerable actinic keratoses over the dorsal hands and forearms.  Nursing note and vitals reviewed.   Imaging: Ir Radiologist Eval & Mgmt  Result Date: 05/07/2016 Please refer to "Notes" to see consult details.   Labs:  CBC: No results for input(s):  WBC, HGB, HCT, PLT in the last 8760 hours.  COAGS: No results for input(s): INR, APTT in the last 8760 hours.  BMP: No results for input(s): NA, K, CL, CO2, GLUCOSE, BUN, CALCIUM, CREATININE, GFRNONAA, GFRAA in the last 8760 hours.  Invalid input(s): CMP  LIVER FUNCTION TESTS: No results for input(s): BILITOT, AST, ALT, ALKPHOS, PROT, ALBUMIN in the last 8760 hours.  TUMOR MARKERS: No results for input(s): AFPTM, CEA, CA199, CHROMGRNA in the last 8760 hours.  Assessment and Plan:  Despite his other, unrelated recent health issues, Gregory Cook is doing exceptionally well nearly 18 months status post plan embolization of his hepatic metastatic disease.  1.) Return to clinic in 1 year with repeat MRI of the abdomen with gadolinium contrast 2.) I instructed Gregory Cook to call our office to be seen sooner if he develops any progression or worsening of his diarrhea.  Thank you for this interesting consult.  I greatly enjoyed meeting Gregory Cook and look forward to participating in their care.  A copy of this report was sent to  the requesting provider on this date.  Electronically Signed: [name] 05/07/2016, 1:00 PM   I spent a total of 15 Minutes in face to face in clinical consultation, greater than 50% of which was counseling/coordinating care for metastatic neuroendocrine tumor.

## 2017-02-02 ENCOUNTER — Other Ambulatory Visit (HOSPITAL_COMMUNITY): Payer: Self-pay | Admitting: Hematology and Oncology

## 2017-02-02 DIAGNOSIS — D3A00 Benign carcinoid tumor of unspecified site: Secondary | ICD-10-CM

## 2017-02-05 ENCOUNTER — Ambulatory Visit (HOSPITAL_COMMUNITY)
Admission: RE | Admit: 2017-02-05 | Discharge: 2017-02-05 | Disposition: A | Discharge status: Home / Self Care | Payer: Medicare Other | Source: Ambulatory Visit | Attending: Hematology and Oncology | Admitting: Hematology and Oncology

## 2017-02-05 DIAGNOSIS — C786 Secondary malignant neoplasm of retroperitoneum and peritoneum: Secondary | ICD-10-CM | POA: Diagnosis not present

## 2017-02-05 DIAGNOSIS — D3A00 Benign carcinoid tumor of unspecified site: Secondary | ICD-10-CM | POA: Insufficient documentation

## 2017-02-05 DIAGNOSIS — C7801 Secondary malignant neoplasm of right lung: Secondary | ICD-10-CM | POA: Diagnosis not present

## 2017-02-05 DIAGNOSIS — C787 Secondary malignant neoplasm of liver and intrahepatic bile duct: Secondary | ICD-10-CM | POA: Diagnosis not present

## 2017-02-05 MED ORDER — GALLIUM GA 68 DOTATATE IV KIT
5.1000 | PACK | Freq: Once | INTRAVENOUS | Status: AC
  Administered 2017-02-05: 5.1 via INTRAVENOUS

## 2017-02-24 ENCOUNTER — Other Ambulatory Visit (HOSPITAL_COMMUNITY): Payer: Self-pay | Admitting: Diagnostic Radiology

## 2017-02-24 DIAGNOSIS — C7A8 Other malignant neuroendocrine tumors: Secondary | ICD-10-CM

## 2017-03-12 ENCOUNTER — Encounter (HOSPITAL_COMMUNITY)
Admission: RE | Admit: 2017-03-12 | Discharge: 2017-03-12 | Disposition: A | Discharge status: Home / Self Care | Payer: Medicare Other | Source: Ambulatory Visit | Attending: Diagnostic Radiology | Admitting: Diagnostic Radiology

## 2017-03-12 DIAGNOSIS — C7A8 Other malignant neuroendocrine tumors: Secondary | ICD-10-CM | POA: Insufficient documentation

## 2017-03-12 NOTE — Consult Note (Signed)
Chief Complaint: Patient with metastatic neuroendocrine tumor was for  evaluation Peptide receptor radiotherapy (PRRT) with FV494 DOTATATE (Lutathera).  Referring Physician(s): Dr. Tye Savoy   Patient Status: [place]  History of Present Illness: Gregory Cook is a 66 y.o. male Diagnosed with metastatic neuroendocrine tumor in 2014.  Patient with metastatic disease to liver RIGHT lung nodule, and central mesenteric mass.  Patient on maintenance Sandostatin injection every month for several years.  Additional treatments include bland embolization to hepatic lesions September 2016.    Liver lesions relatively stable following bland embolization.  Recently discovered peritoneal metastasis in the pelvis by DOTATATE PET-CT scan.    Patient has positive DOTATAE scan on 02/05/2017 demonstrating avid uptake within liver lesions, RIGHT lung nodule, central mesenteric mass, and small peritoneal metastasis in the deep pelvis.    Patient reports sporadic diarrhea with no flushing. Intermittent abdominal pain.   Past Medical History:  Diagnosis Date  . Hypertension   . Liver metastasis ([place])   . Stroke [facility])     Allergies: Nuvigil [armodafinil]  Medications: Prior to Admission medications   Medication Sig Start Date End Date Taking? Authorizing Provider  acetaminophen (TYLENOL) 500 MG tablet Take 1,000 mg by mouth every 6 (six) hours as needed.    [provider]  aspirin 81 MG tablet Take 81 mg by mouth daily.    [provider]  atenolol (TENORMIN) 50 MG tablet Take 50 mg by mouth daily.    [provider]  atorvastatin (LIPITOR) 40 MG tablet Take 40 mg by mouth daily.    [provider]  butalbital-acetaminophen-caffeine (FIORICET, ESGIC) 50-325-40 MG per tablet Take 1 tablet by mouth every 4 (four) hours as needed for headache.    [provider]  celecoxib (CELEBREX) 200 MG capsule Take 200 mg by mouth 2 (two) times daily.     [provider]  cholestyramine light (PREVALITE) 4 GM/DOSE powder Take 4 g by mouth 2 (two) times daily with a meal.    [provider]  diclofenac sodium (VOLTAREN) 1 % GEL Apply topically 4 (four) times daily as needed.    [provider]  diltiazem (DILACOR XR) 120 MG 24 hr capsule Take 120 mg by mouth daily.    [provider]  diphenoxylate-atropine (LOMOTIL) 2.5-0.025 MG per tablet Take 1 tablet by mouth 2 (two) times daily.    [provider]  famotidine (PEPCID) 10 MG tablet Take by mouth.    [provider]  octreotide (SANDOSTATIN LAR) 30 MG injection Inject 30 mg into the muscle every 28 (twenty-eight) days.    [provider]  rOPINIRole (REQUIP) 1 MG tablet Take 1 mg by mouth at bedtime.    [provider]  terazosin (HYTRIN) 10 MG capsule Take 10 mg by mouth at bedtime.    [provider]  testosterone cypionate (DEPOTESTOTERONE CYPIONATE) 100 MG/ML injection Inject 200 mg into the muscle every 14 (fourteen) days. For IM use only    [provider]  triamterene-hydrochlorothiazide (MAXZIDE) 75-50 MG per tablet Take 1 tablet by mouth daily.    [provider]     No family history on file.  Social History   Socioeconomic History  . Marital status: Married    Spouse name: Not on file  . Number of children: Not on file  . Years of education: Not on file  . Highest education level: Not on file  Social Needs  . Financial resource strain: Not on  file  . Food insecurity - worry: Not on file  . Food insecurity - inability: Not on file  . Transportation needs - medical: Not on file  . Transportation needs - non-medical: Not on file  Occupational History  . Not on file  Tobacco Use  . Smoking status: Never Smoker  . Smokeless tobacco: Never Used  Substance and Sexual Activity  . Alcohol use: No    Alcohol/week: 0.0 oz  . Drug use: No  . Sexual activity: Not on file  Other  Topics Concern  . Not on file  Social History Narrative  . Not on file    ECOG Status: 1 - Symptomatic but completely ambulatory  Review of Systems: A 12 point ROS discussed and pertinent positives are indicated in the HPI above.  All other systems are negative.  Review of Systems  HENT: Negative.   Respiratory: Negative.   Cardiovascular: Negative.   Gastrointestinal: Positive for abdominal pain and diarrhea.  Skin: Negative.     Vital Signs: There were no vitals taken for this visit.  Physical Exam  Constitutional: He appears well-developed and well-nourished.  HENT:  Head: Normocephalic and atraumatic.  Eyes: EOM are normal.  Cardiovascular: Normal rate, regular rhythm, normal heart sounds and intact distal pulses.  Pulmonary/Chest: Effort normal and breath sounds normal.  Abdominal: Soft.  Skin: Capillary refill takes less than 2 seconds.    Imaging: Nm Pet (netspot Ga 68 Dotatate) Skull Base To Mid Thigh  Result Date: 02/05/2017 CLINICAL DATA:  Initial treatment strategy for neuroendocrine tumor. EXAM: NUCLEAR MEDICINE PET SKULL BASE TO THIGH TECHNIQUE: 5.1 MCi Ga 68 DOTATATE was injected intravenously. Full-ring PET imaging was performed from the skull base to thigh after the radiotracer. CT data was obtained and used for attenuation correction and anatomic localization. COMPARISON:  Normal FINDINGS: NECK No radiotracer activity in neck lymph nodes. CHEST Within the RIGHT upper lobe 14 mm nodule has intense somatostatin receptor radiotracer activity with SUV max equal 22. ABDOMEN/PELVIS Multiple approximately 1-2 cm lesions in the RIGHT hepatic lobe (segment 7) have radiotracer accumulation with SUV max equal 14.4. Probable lesion in the anterior LEFT lateral hepatic lobe with SUV max equal 11.5. Nodule lesion within the RIGHT abdomen mesenteries with a speckled calcification and retractile characteristics in the small bowel mesenteries measures 16 x 28 mm (image 149,  series 4 and has intense somatostatin receptor radiotracer activity with SUV max equal 32.9. Two small lesions within the deep RIGHT pelvis along the peritoneal reflection anterior the rectum and superior to the seminal vesicles have intense radiotracer activity with SUV max equal 19.9. Physiologic activity noted in the liver, spleen, adrenal glands and kidneys. SKELETON No focal activity to suggest skeletal metastasis. IMPRESSION: 1. Intense somatostatin receptor radiotracer activity associated with the central RIGHT mesenteric nodular mass is consistent with well differentiated neuroendocrine tumor. 2. Evidence a well differentiated neuroendocrine tumor metastasis within both hepatic lobes as well as the RIGHT upper lung lobe. 3. Subtle peritoneal nodular metastasis in the deep RIGHT pelvis. 4. No clear primary lesion identified. With high tumor affinity for neuroendocrine tumor specific Ga 68 DOTATATE PET agent, patient would be a candidate for peptide receptor radiotherapy with Lu 177 DOTATATE therapy (Lutathera). Contact Molecular Imaging Dept. at [facility] [phone]). Electronically Signed   By: Suzy Bouchard M.D.   On: 02/05/2017 13:07   Labs:  CBC: No results for input(s): WBC, HGB, HCT, PLT in the last 8760 hours.  COAGS: No results for input(s):  INR, APTT in the last 8760 hours.  BMP: No results for input(s): NA, K, CL, CO2, GLUCOSE, BUN, CALCIUM, CREATININE, GFRNONAA, GFRAA in the last 8760 hours.  Invalid input(s): CMP  LIVER FUNCTION TESTS: No results for input(s): BILITOT, AST, ALT, ALKPHOS, PROT, ALBUMIN in the last 8760 hours.  TUMOR MARKERS: No results for input(s): AFPTM, CEA, CA199, CHROMOGRNA in the last 8760 hours.  Assessment and Plan:  Patient is a good candidate for Lu177 DOTATATE  therapy. Patient has metastatic well differentiated neuroendocrine tumor which is positive for the somatostatin receptor avid radiotracer Ga 68 DOTATATE.   Metastatic  lesions the RIGHT lung, liver, central mesentery, and peritoneal surface .  Patient has carcinoid symptoms which include diarrhea.  Patient has been educated on the risk and benefits of treatment and meets minimal requirements of renal fucntion, hepatic function, and marrow function.  Blood counts and renal function and liver function will be followed closely during therapy.  Patient's last Sandostatin injection 03/03/2017.   Patient scheduled for first treatment on 04/08/16. Patient to receive 40 mg IM Sandostatin following PRRT therapy same day at home cancer center.   Four total treatments 2 months apart.     Thank you for this interesting consult.  I greatly enjoyed meeting Gregory Cook and look forward to participating in their care.  A copy of this report was sent to the requesting provider on this date.  Electronically Signed: Rennis Golden, MD 03/12/2017, 2:11 PM   I spent a total of  30 Minutes   in face to face in clinical consultation, greater than 50% of which was counseling/coordinating care for metastatic neuroednocrin

## 2017-03-16 ENCOUNTER — Other Ambulatory Visit (HOSPITAL_COMMUNITY): Payer: Self-pay | Admitting: Hematology and Oncology

## 2017-03-16 DIAGNOSIS — C7A8 Other malignant neuroendocrine tumors: Secondary | ICD-10-CM

## 2017-04-07 ENCOUNTER — Other Ambulatory Visit (HOSPITAL_COMMUNITY): Payer: Self-pay | Admitting: Hematology and Oncology

## 2017-04-07 ENCOUNTER — Ambulatory Visit (HOSPITAL_COMMUNITY)
Admission: RE | Admit: 2017-04-07 | Discharge: 2017-04-07 | Disposition: A | Discharge status: Home / Self Care | Payer: Medicare Other | Source: Ambulatory Visit | Attending: Hematology and Oncology | Admitting: Hematology and Oncology

## 2017-04-07 DIAGNOSIS — C7A8 Other malignant neuroendocrine tumors: Secondary | ICD-10-CM | POA: Diagnosis not present

## 2017-04-07 LAB — CBC WITH DIFFERENTIAL/PLATELET
Basophils Absolute: 0 10*3/uL (ref 0.0–0.1)
Basophils Relative: 0 %
EOS ABS: 0.1 10*3/uL (ref 0.0–0.7)
Eosinophils Relative: 1 %
HEMATOCRIT: 56.3 % — AB (ref 39.0–52.0)
HEMOGLOBIN: 18.7 g/dL — AB (ref 13.0–17.0)
LYMPHS ABS: 3.9 10*3/uL (ref 0.7–4.0)
Lymphocytes Relative: 23 %
MCH: 29.6 pg (ref 26.0–34.0)
MCHC: 33.2 g/dL (ref 30.0–36.0)
MCV: 89.1 fL (ref 78.0–100.0)
MONOS PCT: 4 %
Monocytes Absolute: 0.7 10*3/uL (ref 0.1–1.0)
NEUTROS ABS: 12.5 10*3/uL — AB (ref 1.7–7.7)
NEUTROS PCT: 72 %
Platelets: 138 10*3/uL — ABNORMAL LOW (ref 150–400)
RBC: 6.32 MIL/uL — AB (ref 4.22–5.81)
RDW: 15.1 % (ref 11.5–15.5)
WBC: 17.2 10*3/uL — AB (ref 4.0–10.5)

## 2017-04-07 LAB — COMPREHENSIVE METABOLIC PANEL
ALK PHOS: 70 U/L (ref 38–126)
ALT: 74 U/L — AB (ref 17–63)
AST: 54 U/L — ABNORMAL HIGH (ref 15–41)
Albumin: 3.9 g/dL (ref 3.5–5.0)
Anion gap: 11 (ref 5–15)
BILIRUBIN TOTAL: 1 mg/dL (ref 0.3–1.2)
BUN: 19 mg/dL (ref 6–20)
CALCIUM: 9.5 mg/dL (ref 8.9–10.3)
CO2: 28 mmol/L (ref 22–32)
CREATININE: 1.14 mg/dL (ref 0.61–1.24)
Chloride: 99 mmol/L — ABNORMAL LOW (ref 101–111)
GFR calc Af Amer: >60 mL/min (ref >60)
GFR calc non Af Amer: >60 mL/min (ref >60)
GLUCOSE: 165 mg/dL — AB (ref 65–99)
Potassium: 3.4 mmol/L — ABNORMAL LOW (ref 3.5–5.1)
SODIUM: 138 mmol/L (ref 135–145)
TOTAL PROTEIN: 6.7 g/dL (ref 6.5–8.1)

## 2017-04-07 MED ORDER — ONDANSETRON HCL 40 MG/20ML IJ SOLN
8.0000 mg | Freq: Once | INTRAMUSCULAR | Status: AC
  Administered 2017-04-07: 8 mg via INTRAVENOUS
  Filled 2017-04-07: qty 4

## 2017-04-07 MED ORDER — AMINO ACID RADIOPROTECTANT - L-LYSINE 2.5%/L-ARGININE 2.5% IN NS
250.0000 mL/h | INTRAVENOUS | Status: AC
  Filled 2017-04-07: qty 1000

## 2017-04-07 MED ORDER — AMINO ACID RADIOPROTECTANT - L-LYSINE 2.5%/L-ARGININE 2.5% IN NS
250.0000 mL/h | INTRAVENOUS | Status: AC
  Administered 2017-04-07: 250 mL/h via INTRAVENOUS
  Filled 2017-04-07: qty 1000

## 2017-04-07 MED ORDER — OCTREOTIDE ACETATE 500 MCG/ML IJ SOLN: 500.0000 ug | Freq: Once | INTRAMUSCULAR | Status: DC | PRN

## 2017-04-07 MED ORDER — LUTETIUM LU 177 DOTATATE 370 MBQ/ML IV SOLN
200.0000 | Freq: Once | INTRAVENOUS | Status: AC
  Administered 2017-04-07: 204.24 via INTRAVENOUS

## 2017-04-07 MED ORDER — OCTREOTIDE ACETATE 500 MCG/ML IJ SOLN
INTRAMUSCULAR | Status: AC
  Filled 2017-04-07: qty 1

## 2017-04-07 MED ORDER — LUTETIUM LU 177 DOTATATE 370 MBQ/ML IV SOLN: 200.0000 | Freq: Once | INTRAVENOUS | Status: DC

## 2017-04-07 MED ORDER — SODIUM CHLORIDE 0.9 % IV SOLN
8.0000 mg | Freq: Once | INTRAVENOUS | Status: DC
  Filled 2017-04-07: qty 4

## 2017-04-07 MED ORDER — SODIUM CHLORIDE 0.9 % IV SOLN: 500.0000 mL | Freq: Once | INTRAVENOUS | Status: DC

## 2017-04-07 NOTE — Procedures (Signed)
Patient received first of four Lutathera Peptide receptor radiotherapy - PRRT. Patient tolerated the procedure well with no nausea or vomiting related to the amino acid infusion. Patient will  received post therapy Sandostatin IM injection tomorrow at the outpatient injection center. Patient will return for follow-up  and laboratory assessment (CBC and CMP) with Dr.Sanders in 2-4 weeks. Patient will return in the 8 to 10 weeks to [place]. for next PRRT. Patient may receive interim Sandostatin injections during the therapy period if asymptomatic of carcinoid symptoms.

## 2017-04-08 ENCOUNTER — Other Ambulatory Visit (HOSPITAL_COMMUNITY): Payer: Medicare Other

## 2017-04-16 ENCOUNTER — Telehealth: Payer: Self-pay | Admitting: Radiology

## 2017-04-16 NOTE — Telephone Encounter (Signed)
Called patient re:  IR follow up with Dr Laurence Ferrari.  Per Dr Laurence Ferrari, IR follow up will be postponed until Outlook therapy has been completed (Dr. Eden Emms).  Jyquan Kenley Riki Rusk, RN 04/16/2017 1:56 PM

## 2017-06-02 ENCOUNTER — Encounter (HOSPITAL_COMMUNITY)
Admission: RE | Admit: 2017-06-02 | Discharge: 2017-06-02 | Disposition: A | Discharge status: Home / Self Care | Payer: Medicare Other | Source: Ambulatory Visit | Attending: Hematology and Oncology | Admitting: Hematology and Oncology

## 2017-06-02 DIAGNOSIS — C7A8 Other malignant neuroendocrine tumors: Secondary | ICD-10-CM | POA: Diagnosis not present

## 2017-06-02 LAB — COMPREHENSIVE METABOLIC PANEL
ALK PHOS: 82 U/L (ref 38–126)
ALT: 46 U/L (ref 17–63)
AST: 71 U/L — AB (ref 15–41)
Albumin: 4.5 g/dL (ref 3.5–5.0)
Anion gap: 12 (ref 5–15)
BILIRUBIN TOTAL: 1.8 mg/dL — AB (ref 0.3–1.2)
BUN: 15 mg/dL (ref 6–20)
CALCIUM: 9.7 mg/dL (ref 8.9–10.3)
CHLORIDE: 101 mmol/L (ref 101–111)
CO2: 26 mmol/L (ref 22–32)
CREATININE: 1.38 mg/dL — AB (ref 0.61–1.24)
GFR, EST AFRICAN AMERICAN: 59 mL/min — AB (ref >60)
GFR, EST NON AFRICAN AMERICAN: 51 mL/min — AB (ref >60)
Glucose, Bld: 167 mg/dL — ABNORMAL HIGH (ref 65–99)
Potassium: 4.5 mmol/L (ref 3.5–5.1)
Sodium: 139 mmol/L (ref 135–145)
TOTAL PROTEIN: 7.9 g/dL (ref 6.5–8.1)

## 2017-06-02 LAB — CBC WITH DIFFERENTIAL/PLATELET
BASOS ABS: 0 10*3/uL (ref 0.0–0.1)
Basophils Relative: 0 %
EOS PCT: 2 %
Eosinophils Absolute: 0.1 10*3/uL (ref 0.0–0.7)
HEMATOCRIT: 47.6 % (ref 39.0–52.0)
Hemoglobin: 16.7 g/dL (ref 13.0–17.0)
LYMPHS ABS: 1.4 10*3/uL (ref 0.7–4.0)
Lymphocytes Relative: 17 %
MCH: 31.3 pg (ref 26.0–34.0)
MCHC: 35.1 g/dL (ref 30.0–36.0)
MCV: 89.1 fL (ref 78.0–100.0)
Monocytes Absolute: 0.6 10*3/uL (ref 0.1–1.0)
Monocytes Relative: 6 %
NEUTROS ABS: 6.5 10*3/uL (ref 1.7–7.7)
Neutrophils Relative %: 75 %
PLATELETS: 139 10*3/uL — AB (ref 150–400)
RBC: 5.34 MIL/uL (ref 4.22–5.81)
RDW: 15.8 % — ABNORMAL HIGH (ref 11.5–15.5)
WBC: 8.7 10*3/uL (ref 4.0–10.5)

## 2017-06-02 MED ORDER — OCTREOTIDE ACETATE 500 MCG/ML IJ SOLN: 500.0000 ug | Freq: Once | INTRAMUSCULAR | Status: DC | PRN

## 2017-06-02 MED ORDER — OCTREOTIDE ACETATE 500 MCG/ML IJ SOLN
INTRAMUSCULAR | Status: AC
  Filled 2017-06-02: qty 1

## 2017-06-02 MED ORDER — SODIUM CHLORIDE 0.9 % IV SOLN
8.0000 mg | Freq: Once | INTRAVENOUS | Status: AC
  Administered 2017-06-02: 8 mg via INTRAVENOUS
  Filled 2017-06-02: qty 4

## 2017-06-02 MED ORDER — OCTREOTIDE ACETATE 30 MG IM KIT
PACK | INTRAMUSCULAR | Status: AC
  Filled 2017-06-02: qty 1

## 2017-06-02 MED ORDER — OCTREOTIDE ACETATE 30 MG IM KIT: 30.0000 mg | PACK | Freq: Once | INTRAMUSCULAR | Status: DC

## 2017-06-02 MED ORDER — LUTETIUM LU 177 DOTATATE 370 MBQ/ML IV SOLN
200.0000 | Freq: Once | INTRAVENOUS | Status: AC
  Administered 2017-06-02: 205.63 via INTRAVENOUS

## 2017-06-02 MED ORDER — AMINO ACID RADIOPROTECTANT - L-LYSINE 2.5%/L-ARGININE 2.5% IN NS
250.0000 mL/h | INTRAVENOUS | Status: AC
  Administered 2017-06-02: 250 mL/h via INTRAVENOUS
  Filled 2017-06-02: qty 1000

## 2017-06-02 MED ORDER — SODIUM CHLORIDE 0.9 % IV SOLN: 500.0000 mL | Freq: Once | INTRAVENOUS | Status: DC

## 2017-06-02 MED ORDER — OCTREOTIDE ACETATE 20 MG IM KIT
40.0000 mg | PACK | Freq: Once | INTRAMUSCULAR | Status: AC
  Administered 2017-06-02: 40 mg via INTRAMUSCULAR
  Filled 2017-06-02: qty 2

## 2017-06-02 NOTE — Progress Notes (Signed)
Patient received second of four Lutathera Peptide receptor radiotherapy - PRRT  (200 mCi Lu 177 DOTATATE).  The patient's most recent blood counts were reviewed and remains a good candidate to proceed with Lutathera.  Patient's bilirubin and creatinine in are mildly elevated compared to baseline but within acceptable limits for therapy.  Patient reports continued intermittent diarrhea.  Some fatigue following initial therapy.     Patient tolerated the procedure well with no nausea or vomiting related to the amino acid infusion. Patient received post therapy Sandostatin 30 mg  IM injection same day   . Patient will return for follow-up  and laboratory assessment (CBC and CMP) with Dr. Baird Cancer in 4 weeks. Patient will return in the 8 to 10 weeks to [place]. for next PRRT. Patient may receive interval Sandostatin injection

## 2017-06-02 NOTE — Progress Notes (Signed)
Creatinine, Ser 0.61 - 1.24 mg/dL 1.38 Abnormally high    Calcium 8.9 - 10.3 mg/dL 9.7   Total Protein 6.5 - 8.1 g/dL 7.9   Albumin 3.5 - 5.0 g/dL 4.5   AST 15 - 41 U/L 71 Abnormally high    ALT 17 - 63 U/L 46   Alkaline Phosphatase 38 - 126 U/L 82   Total Bilirubin 0.3 - 1.2 mg/dL 1.8 Abnormally high    GFR calc non Af Amer >60 mL/min 51 Abnormally low    GFR calc Af Amer >60 mL/min 59 Abnormally low       40 mg IM Sandostatin was given post Lutathera treatment  (increase from typical post therapy 30 mg) to match pateint's current dose schedule.Marland Kitchen RE: Symptomatic diarhhea

## 2017-06-03 ENCOUNTER — Other Ambulatory Visit (HOSPITAL_COMMUNITY): Payer: Medicare Other

## 2017-07-06 ENCOUNTER — Other Ambulatory Visit (HOSPITAL_COMMUNITY): Payer: Self-pay | Admitting: Hematology and Oncology

## 2017-07-06 ENCOUNTER — Other Ambulatory Visit (HOSPITAL_COMMUNITY): Payer: Self-pay | Admitting: Diagnostic Radiology

## 2017-07-06 DIAGNOSIS — C7A8 Other malignant neuroendocrine tumors: Secondary | ICD-10-CM

## 2017-07-29 ENCOUNTER — Encounter (HOSPITAL_COMMUNITY)
Admission: RE | Admit: 2017-07-29 | Discharge: 2017-07-29 | Disposition: A | Discharge status: Home / Self Care | Payer: Medicare Other | Source: Ambulatory Visit | Attending: Hematology and Oncology | Admitting: Hematology and Oncology

## 2017-07-29 ENCOUNTER — Encounter (HOSPITAL_COMMUNITY): Payer: Medicare Other

## 2017-07-29 DIAGNOSIS — C7A8 Other malignant neuroendocrine tumors: Secondary | ICD-10-CM | POA: Insufficient documentation

## 2017-07-29 LAB — COMPREHENSIVE METABOLIC PANEL
ALT: 32 U/L (ref 17–63)
AST: 35 U/L (ref 15–41)
Albumin: 4.2 g/dL (ref 3.5–5.0)
Alkaline Phosphatase: 82 U/L (ref 38–126)
Anion gap: 12 (ref 5–15)
BILIRUBIN TOTAL: 0.6 mg/dL (ref 0.3–1.2)
BUN: 17 mg/dL (ref 6–20)
CHLORIDE: 103 mmol/L (ref 101–111)
CO2: 25 mmol/L (ref 22–32)
CREATININE: 1.19 mg/dL (ref 0.61–1.24)
Calcium: 9.6 mg/dL (ref 8.9–10.3)
GFR calc Af Amer: >60 mL/min (ref >60)
Glucose, Bld: 181 mg/dL — ABNORMAL HIGH (ref 65–99)
POTASSIUM: 3.3 mmol/L — AB (ref 3.5–5.1)
Sodium: 140 mmol/L (ref 135–145)
Total Protein: 7.3 g/dL (ref 6.5–8.1)

## 2017-07-29 LAB — CBC WITH DIFFERENTIAL/PLATELET
BASOS ABS: 0 10*3/uL (ref 0.0–0.1)
BASOS PCT: 0 %
EOS ABS: 0.1 10*3/uL (ref 0.0–0.7)
EOS PCT: 1 %
HCT: 49.6 % (ref 39.0–52.0)
Hemoglobin: 16.9 g/dL (ref 13.0–17.0)
Lymphocytes Relative: 16 %
Lymphs Abs: 1.4 10*3/uL (ref 0.7–4.0)
MCH: 31.1 pg (ref 26.0–34.0)
MCHC: 34.1 g/dL (ref 30.0–36.0)
MCV: 91.2 fL (ref 78.0–100.0)
MONO ABS: 0.5 10*3/uL (ref 0.1–1.0)
MONOS PCT: 5 %
Neutro Abs: 6.6 10*3/uL (ref 1.7–7.7)
Neutrophils Relative %: 78 %
PLATELETS: 127 10*3/uL — AB (ref 150–400)
RBC: 5.44 MIL/uL (ref 4.22–5.81)
RDW: 15.2 % (ref 11.5–15.5)
WBC: 8.4 10*3/uL (ref 4.0–10.5)

## 2017-07-29 MED ORDER — OCTREOTIDE ACETATE 20 MG IM KIT: 20.0000 mg | PACK | Freq: Once | INTRAMUSCULAR | Status: DC

## 2017-07-29 MED ORDER — OCTREOTIDE ACETATE 30 MG IM KIT: 30.0000 mg | PACK | Freq: Once | INTRAMUSCULAR | Status: DC

## 2017-07-29 MED ORDER — AMINO ACID RADIOPROTECTANT - L-LYSINE 2.5%/L-ARGININE 2.5% IN NS
250.0000 mL/h | INTRAVENOUS | Status: AC
  Administered 2017-07-29: 250 mL/h via INTRAVENOUS
  Filled 2017-07-29: qty 1000

## 2017-07-29 MED ORDER — LUTETIUM LU 177 DOTATATE 370 MBQ/ML IV SOLN
200.0000 | Freq: Once | INTRAVENOUS | Status: AC
  Administered 2017-07-29: 205.1 via INTRAVENOUS

## 2017-07-29 MED ORDER — SODIUM CHLORIDE 0.9 % IV SOLN
8.0000 mg | Freq: Once | INTRAVENOUS | Status: AC
  Administered 2017-07-29: 8 mg via INTRAVENOUS
  Filled 2017-07-29: qty 4

## 2017-07-29 MED ORDER — OCTREOTIDE ACETATE 500 MCG/ML IJ SOLN
INTRAMUSCULAR | Status: AC
  Filled 2017-07-29: qty 1

## 2017-07-29 MED ORDER — OCTREOTIDE ACETATE 20 MG IM KIT
40.0000 mg | PACK | Freq: Once | INTRAMUSCULAR | Status: AC
  Administered 2017-07-29: 40 mg via INTRAMUSCULAR
  Filled 2017-07-29: qty 2

## 2017-07-29 MED ORDER — SODIUM CHLORIDE 0.9 % IV SOLN: 500.0000 mL | Freq: Once | INTRAVENOUS | Status: DC

## 2017-07-29 MED ORDER — OCTREOTIDE ACETATE 30 MG IM KIT
PACK | INTRAMUSCULAR | Status: AC
  Filled 2017-07-29: qty 1

## 2017-07-29 NOTE — Progress Notes (Addendum)
06/30/2017 8:41 AM  At [facility]   Component Results   Component Value Ref Range & Units Status Lab  WBC 11.5   4.0 - 10.5 x 10*3/uL [facility]  RBC 5.58  4.22 - 5.81 x 10*6/uL [facility]  Hemoglobin 17.4   13.0 - 17.0 G/DL Final [facility]  Hematocrit 50.0  39.0 - 52.0 % Final [facility]  MCV 89.6  78.0 - 100.0 FL Final [facility]  MCH 31.2  26.0 - 34.0 PG Final [facility]  MCHC 34.9  30.0 - 36.0 G/DL [facility]  RDW 15.8   11.5 - 15.5 % Final [facility]  Platelets 81   150 - 400 X 10*3/uL [facility]   07/29/17 Labs at [facility]  Sodium 135 - 145 mmol/L 140   Potassium 3.5 - 5.1 mmol/L 3.3Low    Chloride 101 - 111 mmol/L 103   CO2 22 - 32 mmol/L 25   Glucose, Bld 65 - 99 mg/dL 181High    BUN 6 - 20 mg/dL 17   Creatinine, Ser 0.61 - 1.24 mg/dL 1.19   Calcium 8.9 - 10.3 mg/dL 9.6   Total Protein 6.5 - 8.1 g/dL 7.3   Albumin 3.5 - 5.0 g/dL 4.2   AST 15 - 41 U/L 35   ALT 17 - 63 U/L 32   Alkaline Phosphatase 38 - 126 U/L 82   Total Bilirubin 0.3 - 1.2 mg/dL 0.6   GFR calc non Af Amer >60 mL/min >60   GFR calc Af Amer >60 mL/min >60      Other Results from 07/29/2017   Contains abnormal data CBC with Differential/Platelet  Order: [id number]   Status:  Final result  Visible to patient:  No (Not Released)  Next appt:  09/23/2017 at 08:00 AM in Radiology ([facility] LUTATHERA [facility])   Ref Range & Units 08:30  WBC 4.0 - 10.5 K/uL 8.4   RBC 4.22 - 5.81 MIL/uL 5.44   Hemoglobin 13.0 - 17.0 g/dL 16.9   HCT 39.0 - 52.0 % 49.6   MCV 78.0 - 100.0 fL 91.2   MCH 26.0 - 34.0 pg 31.1   MCHC 30.0 - 36.0 g/dL 34.1   RDW 11.5 - 15.5 % 15.2   Platelets 150 - 400 K/uL 127Low    Neutrophils Relative % % 78   Neutro Abs 1.7 - 7.7 K/uL 6.6   Lymphocytes Relative % 16   Lymphs Abs 0.7 - 4.0 K/uL 1.4   Monocytes Relative % 5   Monocytes Absolute 0.1 - 1.0 K/uL 0.5   Eosinophils Relative % 1   Eosinophils Absolute 0.0 - 0.7 K/uL 0.1   Basophils  Relative % 0   Basophils Absolute 0.0 - 0.1 K/uL 0.0         Diagnosis: [Metastatic neuroendocrine tumor.]  [205.1] mCi Lu 177 DOTATATE   Current Infusion: [3]  Planned Infusions: [4]  Patient presented to nuclear medicine for treatment.  Interval thrombocytopenia with platelets equal 81,000 on 06/30/2017.  Plates a platelet elevated today with platelet counts equal 127,000.  Additional blood counts, liver function and renal function all within acceptable range.  The patient's remains a good candidate to proceed with full dose Lutathera. The patient was situated in an infusion suite and administered Lutathera as above. Patient will follow-up with referring oncologist for interval serum laboratories in 4 weeks.  Patient received 40 mg IM long-acting Sandostatin injection 4 hours  after Lutathera effusion in the nuclear medicine department.   IMPRESSION:  [Third] OB 096 [id number] treatment for metastatic neuroendocrine tumor. The patient tolerated the infusion well.  Patient will return in 8 weeks for  4th and final therapy.

## 2017-08-10 ENCOUNTER — Other Ambulatory Visit (HOSPITAL_COMMUNITY): Payer: Self-pay | Admitting: Hematology and Oncology

## 2017-08-10 DIAGNOSIS — C7A8 Other malignant neuroendocrine tumors: Secondary | ICD-10-CM

## 2017-09-23 ENCOUNTER — Other Ambulatory Visit (HOSPITAL_COMMUNITY): Payer: Medicare Other

## 2017-09-23 ENCOUNTER — Encounter (HOSPITAL_COMMUNITY)
Admission: RE | Admit: 2017-09-23 | Discharge: 2017-09-23 | Disposition: A | Discharge status: Home / Self Care | Payer: Medicare Other | Source: Ambulatory Visit | Attending: Hematology and Oncology | Admitting: Hematology and Oncology

## 2017-09-23 DIAGNOSIS — C7A8 Other malignant neuroendocrine tumors: Secondary | ICD-10-CM | POA: Diagnosis not present

## 2017-09-23 LAB — BASIC METABOLIC PANEL
Anion gap: 14 (ref 5–15)
BUN: 17 mg/dL (ref 8–23)
CALCIUM: 8.8 mg/dL — AB (ref 8.9–10.3)
CHLORIDE: 105 mmol/L (ref 98–111)
CO2: 22 mmol/L (ref 22–32)
CREATININE: 1.2 mg/dL (ref 0.61–1.24)
GFR calc Af Amer: >60 mL/min (ref >60)
GFR calc non Af Amer: 60 mL/min — ABNORMAL LOW (ref >60)
Glucose, Bld: 203 mg/dL — ABNORMAL HIGH (ref 70–99)
Potassium: 3.1 mmol/L — ABNORMAL LOW (ref 3.5–5.1)
Sodium: 141 mmol/L (ref 135–145)

## 2017-09-23 LAB — CBC WITH DIFFERENTIAL/PLATELET
BASOS PCT: 0 %
Basophils Absolute: 0 10*3/uL (ref 0.0–0.1)
EOS ABS: 0.1 10*3/uL (ref 0.0–0.7)
EOS PCT: 1 %
HCT: 48.5 % (ref 39.0–52.0)
Hemoglobin: 16.6 g/dL (ref 13.0–17.0)
LYMPHS ABS: 1.1 10*3/uL (ref 0.7–4.0)
Lymphocytes Relative: 18 %
MCH: 31 pg (ref 26.0–34.0)
MCHC: 34.2 g/dL (ref 30.0–36.0)
MCV: 90.7 fL (ref 78.0–100.0)
MONO ABS: 0.2 10*3/uL (ref 0.1–1.0)
MONOS PCT: 4 %
Neutro Abs: 4.8 10*3/uL (ref 1.7–7.7)
Neutrophils Relative %: 77 %
PLATELETS: 96 10*3/uL — AB (ref 150–400)
RBC: 5.35 MIL/uL (ref 4.22–5.81)
RDW: 15 % (ref 11.5–15.5)
WBC: 6.3 10*3/uL (ref 4.0–10.5)

## 2017-09-23 MED ORDER — OCTREOTIDE ACETATE 500 MCG/ML IJ SOLN: 500.0000 ug | Freq: Once | INTRAMUSCULAR | Status: DC | PRN

## 2017-09-23 MED ORDER — OCTREOTIDE ACETATE 20 MG IM KIT
40.0000 mg | PACK | Freq: Once | INTRAMUSCULAR | Status: AC
  Administered 2017-09-23: 40 mg via INTRAMUSCULAR
  Filled 2017-09-23: qty 2

## 2017-09-23 MED ORDER — OCTREOTIDE ACETATE 500 MCG/ML IJ SOLN
INTRAMUSCULAR | Status: AC
  Filled 2017-09-23: qty 1

## 2017-09-23 MED ORDER — OCTREOTIDE ACETATE 30 MG IM KIT
PACK | INTRAMUSCULAR | Status: AC
  Filled 2017-09-23: qty 1

## 2017-09-23 MED ORDER — OCTREOTIDE ACETATE 30 MG IM KIT: 30.0000 mg | PACK | Freq: Once | INTRAMUSCULAR | Status: DC

## 2017-09-23 MED ORDER — PROCHLORPERAZINE EDISYLATE 10 MG/2ML IJ SOLN
10.0000 mg | Freq: Four times a day (QID) | INTRAMUSCULAR | Status: DC | PRN
  Filled 2017-09-23: qty 2

## 2017-09-23 MED ORDER — LUTETIUM LU 177 DOTATATE 370 MBQ/ML IV SOLN
200.0000 | Freq: Once | INTRAVENOUS | Status: AC
  Administered 2017-09-23: 206 via INTRAVENOUS

## 2017-09-23 MED ORDER — SODIUM CHLORIDE 0.9 % IV SOLN: 500.0000 mL | Freq: Once | INTRAVENOUS | Status: DC

## 2017-09-23 MED ORDER — SODIUM CHLORIDE 0.9 % IV SOLN
8.0000 mg | Freq: Once | INTRAVENOUS | Status: AC
  Administered 2017-09-23: 8 mg via INTRAVENOUS
  Filled 2017-09-23: qty 4

## 2017-09-23 MED ORDER — ONDANSETRON HCL 8 MG PO TABS: 8.0000 mg | ORAL_TABLET | Freq: Two times a day (BID) | ORAL | 0 refills | Status: DC | PRN

## 2017-09-23 MED ORDER — AMINO ACID RADIOPROTECTANT - L-LYSINE 2.5%/L-ARGININE 2.5% IN NS
250.0000 mL/h | INTRAVENOUS | Status: AC
  Administered 2017-09-23: 250 mL/h via INTRAVENOUS
  Filled 2017-09-23: qty 1000

## 2017-10-13 ENCOUNTER — Other Ambulatory Visit (HOSPITAL_COMMUNITY): Payer: Self-pay | Admitting: Interventional Radiology

## 2017-10-13 DIAGNOSIS — C7A8 Other malignant neuroendocrine tumors: Secondary | ICD-10-CM

## 2017-10-13 DIAGNOSIS — C7B8 Other secondary neuroendocrine tumors: Principal | ICD-10-CM

## 2017-10-21 ENCOUNTER — Encounter (HOSPITAL_COMMUNITY)
Admission: RE | Admit: 2017-10-21 | Discharge: 2017-10-21 | Disposition: A | Discharge status: Home / Self Care | Payer: Medicare Other | Source: Ambulatory Visit | Attending: Hematology and Oncology | Admitting: Hematology and Oncology

## 2017-10-21 DIAGNOSIS — C7A8 Other malignant neuroendocrine tumors: Secondary | ICD-10-CM | POA: Diagnosis present

## 2017-10-21 MED ORDER — GALLIUM GA 68 DOTATATE IV KIT
4.9000 | PACK | Freq: Once | INTRAVENOUS | Status: AC
  Administered 2017-10-21: 4.9 via INTRAVENOUS

## 2017-10-27 ENCOUNTER — Encounter (HOSPITAL_COMMUNITY)
Admission: RE | Admit: 2017-10-27 | Discharge: 2017-10-27 | Disposition: A | Discharge status: Home / Self Care | Payer: Medicare Other | Source: Ambulatory Visit | Attending: Hematology and Oncology | Admitting: Hematology and Oncology

## 2017-10-27 DIAGNOSIS — C7A8 Other malignant neuroendocrine tumors: Secondary | ICD-10-CM | POA: Insufficient documentation

## 2017-10-27 NOTE — Consult Note (Signed)
Chief Complaint: Patient with metastatic neuroendocrine tumor post completion of 4 cycles Peptide receptor radiotherapy (PRRT) with NW295 DOTATATE (Lutathera).  Referring Physician(s):Sanders   Patient Status: [facility] - Out-pt  History of Present Illness: Gregory Cook is a 67 y.o. male Diagnosed with metastatic neuroendocrine tumor in 2014.  Patient with metastatic disease to liver RIGHT lung nodule, and central mesenteric mass.  Patient on maintenance Sandostatin injection every month for several years.  Additional treatments include bland embolization to hepatic lesions September 2016.    Liver lesions relatively stable following bland embolization.  Recently discovered peritoneal metastasis in the pelvis by DOTATATE PET-CT scan.    Patient has positive DOTATAE scan on 02/05/2017 demonstrating avid uptake within liver lesions, RIGHT lung nodule, central mesenteric mass, and small peritoneal metastasis in the deep pelvis.    Patient reports sporadic diarrhea with no flushing. Intermittent abdominal pain.  Patient presents post completion LU 177 DOTATATE peptide receptor radiotherapy..  Patient received four 200 mCitreatments over 6 months.  Last treatment 09/23/2017].  Patient tolerated treatment well.        Past Medical History:  Diagnosis Date  . Hypertension   . Liver metastasis ([place])   . Stroke [facility])      Allergies: Nuvigil [armodafinil]  Medications: Prior to Admission medications   Medication Sig Start Date End Date Taking? Authorizing Provider  acetaminophen (TYLENOL) 500 MG tablet Take 1,000 mg by mouth every 6 (six) hours as needed.    [provider]  aspirin 81 MG tablet Take 81 mg by mouth daily.    [provider]  atenolol (TENORMIN) 50 MG tablet Take 50 mg by mouth daily.    [provider]  atorvastatin (LIPITOR) 40 MG tablet Take 40 mg by mouth daily.    [provider]  butalbital-acetaminophen-caffeine  (FIORICET, ESGIC) 50-325-40 MG per tablet Take 1 tablet by mouth every 4 (four) hours as needed for headache.    [provider]  celecoxib (CELEBREX) 200 MG capsule Take 200 mg by mouth 2 (two) times daily.    [provider]  cholestyramine light (PREVALITE) 4 GM/DOSE powder Take 4 g by mouth 2 (two) times daily with a meal.    [provider]  diclofenac sodium (VOLTAREN) 1 % GEL Apply topically 4 (four) times daily as needed.    [provider]  diltiazem (DILACOR XR) 120 MG 24 hr capsule Take 120 mg by mouth daily.    [provider]  diphenoxylate-atropine (LOMOTIL) 2.5-0.025 MG per tablet Take 1 tablet by mouth 2 (two) times daily.    [provider]  famotidine (PEPCID) 10 MG tablet Take by mouth.    [provider]  octreotide (SANDOSTATIN LAR) 30 MG injection Inject 30 mg into the muscle every 28 (twenty-eight) days.    [provider]  ondansetron (ZOFRAN) 8 MG tablet Take 1 tablet (8 mg total) by mouth 2 (two) times daily as needed for nausea or vomiting. 09/23/17   Kerby Moors, MD  rOPINIRole (REQUIP) 1 MG tablet Take 1 mg by mouth at bedtime.    [provider]  terazosin (HYTRIN) 10 MG capsule Take 10 mg by mouth at bedtime.    [provider]  testosterone cypionate (DEPOTESTOTERONE CYPIONATE) 100 MG/ML injection Inject 200 mg into the muscle every 14 (fourteen) days. For IM use only    [provider]  triamterene-hydrochlorothiazide (MAXZIDE) 75-50 MG per tablet Take 1 tablet by mouth daily.    [provider]  No family history on file.  Social History   Socioeconomic History  . Marital status: Married    Spouse name: Not on file  . Number of children: Not on file  . Years of education: Not on file  . Highest education level: Not on file  Occupational History  . Not on file  Social Needs  . Financial resource strain: Not on file  . Food insecurity:    Worry:  Not on file    Inability: Not on file  . Transportation needs:    Medical: Not on file    Non-medical: Not on file  Tobacco Use  . Smoking status: Never Smoker  . Smokeless tobacco: Never Used  Substance and Sexual Activity  . Alcohol use: No    Alcohol/week: 0.0 oz  . Drug use: No  . Sexual activity: Not on file  Lifestyle  . Physical activity:    Days per week: Not on file    Minutes per session: Not on file  . Stress: Not on file  Relationships  . Social connections:    Talks on phone: Not on file    Gets together: Not on file    Attends religious service: Not on file    Active member of club or organization: Not on file    Attends meetings of clubs or organizations: Not on file    Relationship status: Not on file  Other Topics Concern  . Not on file  Social History Narrative  . Not on file    ECOG Status: 1 - Symptomatic but completely ambulatory  Review of Systems: A 12 point ROS discussed and pertinent positives are indicated in the HPI above.  All other systems are negative.  Review of Systems  Vital Signs: There were no vitals taken for this visit.  Physical Exam  Imaging: DOTATATE PET/CT  10/21/17 IMPRESSION: 1. Essentially stable disease in the in the liver, central mesenteric mass and deep pelvic mesenteric nodal implant with minimal change in radiotracer activity. 2. Marked reduction in size and radiotracer activity the RIGHT pulmonary nodule metastasis. 3. Of note, peptide receptor radiotherapy has been reported to have continued positive toxic tumor effect for 6 months following completion of Lu177 DOTATATE peptide radiotherapy. Consider follow-up DOTATATE PET scan in 6 months. 4. Focal activity in the anterior inferior LEFT frontal lobe adjacent to the sphenoid bone is favored a benign meningioma over a solitary bone metastasis or brain metastasis. This could be confirmed with MRI of the brain but not necessarily recommended.   Labs:  CBC: Recent Labs    04/07/17 0840 06/02/17 1020 07/29/17 0830 09/23/17 0854  WBC 17.2* 8.7 8.4 6.3  HGB 18.7* 16.7 16.9 16.6  HCT 56.3* 47.6 49.6 48.5  PLT 138* 139* 127* 96*    COAGS: No results for input(s): INR, APTT in the last 8760 hours.  BMP: Recent Labs    04/07/17 0840 06/02/17 1020 07/29/17 0830 09/23/17 0854  NA 138 139 140 141  K 3.4* 4.5 3.3* 3.1*  CL 99* 101 103 105  CO2 28 26 25 22   GLUCOSE 165* 167* 181* 203*  BUN 19 15 17 17   CALCIUM 9.5 9.7 9.6 8.8*  CREATININE 1.14 1.38* 1.19 1.20  GFRNONAA >60 51* >60 60*  GFRAA >60 59* >60 >60    LIVER FUNCTION TESTS: Recent Labs    04/07/17 0840 06/02/17 1020 07/29/17 0830  BILITOT 1.0 1.8* 0.6  AST 54* 71* 35  ALT 74* 46 32  ALKPHOS 70 82  82  PROT 6.7 7.9 7.3  ALBUMIN 3.9 4.5 4.2    TUMOR MARKERS: No results for input(s): AFPTM, CEA, CA199, CHROMOGRNA in the last 8760 hours.  Assessment and Plan:  [1.  Patient presents post completion LU 177 DOTATATE peptide receptor radiotherapy..  Patient received four 200 mCi treatments over 6 months.  Last treatment 09/23/2017].  Patient tolerated treatment well with only mild thrombocytopenia.  Recommend continued monitoring CBC on routine oncology surveillance.  2.  Patient reports continued diarrhea with some worsening of symptoms over the last several weeks / month.  Disappointing that the patient did not have improvement in symptoms; however hopeful that continued radiotoxicity to the tumor well provide some symptomatic relief over the next 6 months.  3.  A post treatment gallium 41 DOTATATE PET scan demonstrated  stable disease in liver and mesentery and reduced tumor activity in lung metastasis.  There is potential of continued decreased in activity abdominal metastasis over next 6 months with reports of delayed radiotoxicity to  tumors. Overall goal of therapy is to prolong progression-free survival and the PET scan demonstrates stable  disease. 4.  Recommend continued Sandostatin monthly injections particularly this patient with symptomatic diarrhea.  5. Consider reassessing Chromagranin A levels   Thank you for this interesting consult.  I greatly enjoyed meeting Yoseph Haile and look forward to participating in their care.  A copy of this report was sent to the requesting provider on this date.  Electronically Signed: Rennis Golden, MD 10/27/2017, 2:57 PM   I spent a total of    15 Minutes in face to face in clinical consultation, greater than 50% of which was counseling/coordinating care for metastatic neuroendocrine tumor.

## 2017-10-29 ENCOUNTER — Encounter: Payer: Self-pay | Admitting: Radiology

## 2017-10-29 ENCOUNTER — Ambulatory Visit
Admission: RE | Admit: 2017-10-29 | Discharge: 2017-10-29 | Disposition: A | Discharge status: Home / Self Care | Payer: Medicare Other | Source: Ambulatory Visit | Attending: Interventional Radiology | Admitting: Interventional Radiology

## 2017-10-29 DIAGNOSIS — C7A8 Other malignant neuroendocrine tumors: Secondary | ICD-10-CM

## 2017-10-29 DIAGNOSIS — C7B8 Other secondary neuroendocrine tumors: Principal | ICD-10-CM

## 2017-10-29 HISTORY — PX: IR RADIOLOGIST EVAL & MGMT: IMG5224

## 2017-10-29 NOTE — Progress Notes (Signed)
Chief Complaint: Patient was seen in follow-up today for  Chief Complaint  Patient presents with  . Follow-up   at the request of [place]  Referring Physician(s): Verner Chol, MD  History of Present Illness: Gregory Cook is a 67 y.o. male with a history of metastatic neuroendocrine tumor initially diagnosed in 2014.  He underwent bland embolization of hepatic metastatic disease in September 2016.  Subsequently, he developed additional metastatic disease with a right pulmonary nodule, and central mesenteric mass as well as additional liver lesions.  He recently underwent peptide receptor radiotherapy with Lutathera (four 200 mCi treatments over 6 months, last treatment 09/23/2017).    His most recent PET/CT imaging demonstrates stable disease in the liver, central mesenteric mass and small peritoneal metastasis in the deep pelvis.  There was a more significant response to therapy with decreased radiotracer activity in the right pulmonary nodule.  Unfortunately, he continues to experience bouts of relatively severe diarrhea occasionally having 5 or 6 bowel movements before noon.  He has been losing weight at a rate of approximately 1 pound per month over the past 2 years.  He looks like he has lost weight.  He is currently treating his diarrhea with a combination of Lomotil and cholestyramine powder.  Previously, he would take the cholestyramine powder after the first few episodes of diarrhea.  Beginning 1 week ago, Dr. Baird Cancer advised him to take the cholestyramine right when he wakes up in the morning in order to prevent the diarrhea.  He has been doing this and feels it is helping.  When he has bouts of multiple episodes of diarrhea, he experiences significant fatigue afterwards which takes 1 to 2 days to resolve.  He begins to feel better right around the time the diarrhea strikes again.  This cycle is decreasing his overall quality of life.  However, he remains in good  spirits and has a very positive attitude.  He denies chest pain, shortness of breath, lower extremity swelling, decreased appetite or other systemic symptoms.  Past Medical History:  Diagnosis Date  . Hypertension   . Liver metastasis ([place])   . Stroke [facility])       Allergies: Nuvigil [armodafinil]  Medications: Prior to Admission medications   Medication Sig Start Date End Date Taking? Authorizing Provider  acetaminophen (TYLENOL) 500 MG tablet Take 1,000 mg by mouth every 6 (six) hours as needed.   Yes [provider]  aspirin 81 MG tablet Take 81 mg by mouth daily.   Yes [provider]  atenolol (TENORMIN) 50 MG tablet Take 50 mg by mouth daily.   Yes [provider]  atorvastatin (LIPITOR) 40 MG tablet Take 40 mg by mouth daily.   Yes [provider]  butalbital-acetaminophen-caffeine (FIORICET, ESGIC) 50-325-40 MG per tablet Take 1 tablet by mouth every 4 (four) hours as needed for headache.   Yes [provider]  cholestyramine light (PREVALITE) 4 GM/DOSE powder Take 4 g by mouth 2 (two) times daily with a meal.   Yes [provider]  diclofenac sodium (VOLTAREN) 1 % GEL Apply topically 4 (four) times daily as needed.   Yes [provider]  diltiazem (DILACOR XR) 120 MG 24 hr capsule Take 120 mg by mouth daily.   Yes [provider]  diphenoxylate-atropine (LOMOTIL) 2.5-0.025 MG per tablet Take 1 tablet by mouth 2 (two) times daily.   Yes [provider]  famotidine (PEPCID) 10 MG tablet Take by mouth.   Yes [provider]  octreotide (SANDOSTATIN LAR) 30 MG injection Inject 30 mg into the muscle every 28 (twenty-eight) days.   Yes [provider]  rOPINIRole (REQUIP) 1 MG tablet Take 1 mg by mouth at bedtime.   Yes [provider]  terazosin (HYTRIN) 10 MG capsule Take 10 mg by mouth at bedtime.   Yes [provider]  testosterone cypionate (DEPOTESTOTERONE  CYPIONATE) 100 MG/ML injection Inject 200 mg into the muscle every 14 (fourteen) days. For IM use only   Yes [provider]  triamterene-hydrochlorothiazide (MAXZIDE) 75-50 MG per tablet Take 1 tablet by mouth daily.   Yes [provider]  celecoxib (CELEBREX) 200 MG capsule Take 200 mg by mouth 2 (two) times daily.    [provider]  ondansetron (ZOFRAN) 8 MG tablet Take 1 tablet (8 mg total) by mouth 2 (two) times daily as needed for nausea or vomiting. Patient not taking: Reported on 10/29/2017 09/23/17   Kerby Moors, MD     No family history on file.  Social History   Socioeconomic History  . Marital status: Married    Spouse name: Not on file  . Number of children: Not on file  . Years of education: Not on file  . Highest education level: Not on file  Occupational History  . Not on file  Social Needs  . Financial resource strain: Not on file  . Food insecurity:    Worry: Not on file    Inability: Not on file  . Transportation needs:    Medical: Not on file    Non-medical: Not on file  Tobacco Use  . Smoking status: Never Smoker  . Smokeless tobacco: Never Used  Substance and Sexual Activity  . Alcohol use: No    Alcohol/week: 0.0 standard drinks  . Drug use: No  . Sexual activity: Not on file  Lifestyle  . Physical activity:    Days per week: Not on file    Minutes per session: Not on file  . Stress: Not on file  Relationships  . Social connections:    Talks on phone: Not on file    Gets together: Not on file    Attends religious service: Not on file    Active member of club or organization: Not on file    Attends meetings of clubs or organizations: Not on file    Relationship status: Not on file  Other Topics Concern  . Not on file  Social History Narrative  . Not on file    ECOG Status: 1 - Symptomatic but completely ambulatory  Review of Systems: A 12 point ROS discussed and pertinent positives are indicated in the HPI  above.  All other systems are negative.  Review of Systems  Vital Signs: BP 107/63   Pulse (!) 53   Temp 98 F (36.7 C) (Oral)   Resp 15   Ht 5\' 11"  (1.803 m)   Wt 106.1 kg   SpO2 97%   BMI 32.64 kg/m   Physical Exam  Constitutional: He is oriented to person, place, and time. He appears well-developed and well-nourished. No distress.  HENT:  Head: Normocephalic and atraumatic.  Eyes: No scleral icterus.  Cardiovascular: Normal rate and regular rhythm.  Pulmonary/Chest: Effort normal.  Abdominal: Soft. He exhibits no distension. There is no tenderness.  Neurological: He is alert and oriented to person, place, and time.  Skin: Skin is warm and dry.  Psychiatric: He has a normal mood and affect. His behavior is  normal.  Nursing note and vitals reviewed.    Imaging: Nm Radiologist Eval And Mgmt  Result Date: 10/27/2017 EXAM: Post peptide receptor radiotherapy Ephriam Knuckles) consult: See epic note for full detail CHIEF COMPLAINT: See epic note Current Pain Level: 1-10 HISTORY OF PRESENT ILLNESS: See epic note. REVIEW OF SYSTEMS: See epic note. PHYSICAL EXAMINATION: See epic note. ASSESSMENT AND PLAN: 1. Patient presents post completion LU 177 DOTATATE peptide receptor radiotherapy. Patient received four 200 mCi treatments over 6 months. Last treatment 09/23/2017. Patient tolerated treatment well with only mild thrombocytopenia. Recommend continued monitoring CBC on routine oncology surveillance. 2. Patient reports continued diarrhea with some worsening of symptoms over the last several weeks / month. Disappointing that the patient did not have improvement in symptoms; however hopeful that continued radiotoxicity to the tumor well provide some symptomatic relief over the next 6 months. 3. A post treatment gallium 26 DOTATATE PET scan demonstrated stable disease in liver and mesentery and reduced tumor activity in lung metastasis. There is potential of continued decreased in activity abdominal  metastasis over next 6 months with reports of delayed radiotoxicity to tumors. Overall goal of therapy is to prolong progression-free survival and the PET scan demonstrates stable disease. 4. Recommend continued Sandostatin monthly injections particularly this patient with symptomatic diarrhea. Electronically Signed   By: Suzy Bouchard M.D.   On: 10/27/2017 15:15   Nm Pet (netspot Ga 4 Dotatate) Skull Base To Mid Thigh  Result Date: 10/21/2017 CLINICAL DATA:  Well differentiated neuroendocrine tumor with liver metastasis, lung metastasis, and central mesenteric mass along the peritoneal metastasis. Patient status post 4 treatments (complete cycle) Lu 177 DOTATATE peptide receptor radiotherapy (Lu Thera. Last treatment 09/23/2017 EXAM: NUCLEAR MEDICINE PET SKULL BASE TO THIGH TECHNIQUE: 4.9 mCi Ga 68 DOTATATE was injected intravenously. Full-ring PET imaging was performed from the skull base to thigh after the radiotracer. CT data was obtained and used for attenuation correction and anatomic localization. COMPARISON:  DOTATATE PET 02/05/2017 FINDINGS: NECK No radiotracer activity in neck lymph nodes. There is a focus of activity localizing to the inferior medial LEFT frontal lobe along the sphenoid bone with intense radiotracer activity (SUV max 26.9). This is focal activity unchanged from prior with SUV max equal 28 6. This is favored a benign lesion such as meningioma. A solitary skeletal metastasis would be a secondary consideration. Incidental CT findings: None CHEST Interval decrease in size and radiotracer activity of the RIGHT upper lobe metastatic nodule which measures 10 mm by 7 mm decreased from 14 mm by 11 mm. Lesion is decreased significantly in radiotracer activity with SUV max equal 3.4 decreased from 21.8. No additional pulmonary nodules are present. No abnormal or radiotracer avid mediastinal lymph nodes. Incidental CT finding:None ABDOMEN/PELVIS No change in the number of radiotracer avid  hepatic lesions. Three adjacent radiotracer avid lesions in the RIGHT hepatic lobe. A single lesion in the subcapsular LEFT hepatic lobe. Potential additional small lesion in or adjacent to the caudate lobe. These metastatic hepatic lesions have radiotracer activity nearly identical to pretreatment exam. For example posterior RIGHT hepatic lobe lesion with SUV max equal 13.4 compares to 14.5. Adjacent larger lesion with SUV max equal 18.9 compared to 14.4. Lesion anterior LEFT hepatic lobe with SUV max equal 10.1 compared to 11.6. As internal reference value, background activity liver (SUV max equal 5.6 compareds to 7.0 on comparison). The central mesenteric mass is unchanged in imaging characteristics and similar and radiotracer activity SUV max equal 32 compared SUV max equal 33. Deep  peritoneal nodular implant along the RIGHT seminal vesicles with SUV max equal 19.9 compared to 19. 2. No new or progressive metastatic neuroendocrine tumor in the abdomen pelvis. Physiologic activity noted in the liver, spleen, adrenal glands and kidneys. Incidental CT findings:None SKELETON No focal activity to suggest skeletal metastasis. Incidental CT findings:None IMPRESSION: 1. Essentially stable disease in the in the liver, central mesenteric mass and deep pelvic mesenteric nodal implant with minimal change in radiotracer activity. 2. Marked reduction in size and radiotracer activity the RIGHT pulmonary nodule metastasis. 3. Of note, peptide receptor radiotherapy has been reported to have continued positive toxic tumor effect for 6 months following completion of Lu177 DOTATATE peptide radiotherapy. Consider follow-up DOTATATE PET scan in 6 months. 4. Focal activity in the anterior inferior LEFT frontal lobe adjacent to the sphenoid bone is favored a benign meningioma over a solitary bone metastasis or brain metastasis. This could be confirmed with MRI of the brain but not necessarily recommended. Electronically Signed   By:  Suzy Bouchard M.D.   On: 10/21/2017 13:29   Ir Radiologist Eval & Mgmt  Result Date: 10/29/2017 Please refer to notes tab for details about interventional procedure. (Op Note)   Labs:  CBC: Recent Labs    04/07/17 0840 06/02/17 1020 07/29/17 0830 09/23/17 0854  WBC 17.2* 8.7 8.4 6.3  HGB 18.7* 16.7 16.9 16.6  HCT 56.3* 47.6 49.6 48.5  PLT 138* 139* 127* 96*    COAGS: No results for input(s): INR, APTT in the last 8760 hours.  BMP: Recent Labs    04/07/17 0840 06/02/17 1020 07/29/17 0830 09/23/17 0854  NA 138 139 140 141  K 3.4* 4.5 3.3* 3.1*  CL 99* 101 103 105  CO2 28 26 25 22   GLUCOSE 165* 167* 181* 203*  BUN 19 15 17 17   CALCIUM 9.5 9.7 9.6 8.8*  CREATININE 1.14 1.38* 1.19 1.20  GFRNONAA >60 51* >60 60*  GFRAA >60 59* >60 >60    LIVER FUNCTION TESTS: Recent Labs    04/07/17 0840 06/02/17 1020 07/29/17 0830  BILITOT 1.0 1.8* 0.6  AST 54* 71* 35  ALT 74* 46 32  ALKPHOS 70 82 82  PROT 6.7 7.9 7.3  ALBUMIN 3.9 4.5 4.2    TUMOR MARKERS: No results for input(s): AFPTM, CEA, CA199, CHROMGRNA in the last 8760 hours.  Assessment and Plan:  Overall, Gregory Cook is doing fairly well.  He has completed his Lutathera treatment.  Most recent PET/CT demonstrates essentially stable disease with persistent activity in his hepatic lesions.  Unfortunately, his symptomatic diarrhea has been worse.  He has recently made a change in his cholestyramine dosing taking it first thing in the morning before he has any episodes of diarrhea.  He has been doing this for about a week and it has helped.  We discussed the risks, benefits and alternatives to repeat plan embolization of his multifocal hepatic lesions.  It is these hepatic lesions which are most likely the source of his underlying diarrhea.  Embolization could decrease the activity of these lesions and thus help alleviate his diarrhea.  He has been through a lot over these past few years and does not want to undergo  another procedure if he does not have to.  Given that he has observing some benefit after the recent change in his cholestyramine dosing, we have decided to give him a few months to further assess these changes.  I will see him again in approximately 3 months.  At that  time, if his diarrheal symptoms persist or are worsening, we will likely consider setting him up for bilateral bland embolization of his hepatic metastatic disease.  1.)  Return clinic visit in 3 months to reassess his diarrhea.  If his symptoms progress in the interim, he may call to schedule the appointment sooner.  We will decide on pursuing repeat bland normalization of his hepatic metastases at this visit.  He would not be a candidate for Y 90 for at least a year given his recent [place].   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

## 2017-12-27 ENCOUNTER — Emergency Department (HOSPITAL_COMMUNITY): Payer: Medicare Other

## 2017-12-27 ENCOUNTER — Inpatient Hospital Stay (HOSPITAL_COMMUNITY)
Admission: EM | Admit: 2017-12-27 | Discharge: 2017-12-30 | DRG: 948 | Disposition: A | Discharge status: Home / Self Care | Payer: Medicare Other | Attending: Internal Medicine | Admitting: Internal Medicine

## 2017-12-27 ENCOUNTER — Encounter (HOSPITAL_COMMUNITY): Payer: Self-pay

## 2017-12-27 ENCOUNTER — Other Ambulatory Visit: Payer: Self-pay

## 2017-12-27 DIAGNOSIS — M542 Cervicalgia: Secondary | ICD-10-CM | POA: Diagnosis present

## 2017-12-27 DIAGNOSIS — E1165 Type 2 diabetes mellitus with hyperglycemia: Secondary | ICD-10-CM | POA: Diagnosis present

## 2017-12-27 DIAGNOSIS — R531 Weakness: Principal | ICD-10-CM | POA: Diagnosis present

## 2017-12-27 DIAGNOSIS — Z79899 Other long term (current) drug therapy: Secondary | ICD-10-CM

## 2017-12-27 DIAGNOSIS — C7800 Secondary malignant neoplasm of unspecified lung: Secondary | ICD-10-CM | POA: Diagnosis present

## 2017-12-27 DIAGNOSIS — Z7984 Long term (current) use of oral hypoglycemic drugs: Secondary | ICD-10-CM

## 2017-12-27 DIAGNOSIS — E119 Type 2 diabetes mellitus without complications: Secondary | ICD-10-CM | POA: Diagnosis not present

## 2017-12-27 DIAGNOSIS — C7B02 Secondary carcinoid tumors of liver: Secondary | ICD-10-CM | POA: Diagnosis not present

## 2017-12-27 DIAGNOSIS — C7B09 Secondary carcinoid tumors of other sites: Secondary | ICD-10-CM | POA: Diagnosis present

## 2017-12-27 DIAGNOSIS — M79641 Pain in right hand: Secondary | ICD-10-CM | POA: Diagnosis present

## 2017-12-27 DIAGNOSIS — M25511 Pain in right shoulder: Secondary | ICD-10-CM | POA: Diagnosis present

## 2017-12-27 DIAGNOSIS — I10 Essential (primary) hypertension: Secondary | ICD-10-CM | POA: Diagnosis not present

## 2017-12-27 DIAGNOSIS — D696 Thrombocytopenia, unspecified: Secondary | ICD-10-CM | POA: Diagnosis present

## 2017-12-27 DIAGNOSIS — C7B8 Other secondary neuroendocrine tumors: Secondary | ICD-10-CM | POA: Diagnosis present

## 2017-12-27 DIAGNOSIS — Z8673 Personal history of transient ischemic attack (TIA), and cerebral infarction without residual deficits: Secondary | ICD-10-CM

## 2017-12-27 DIAGNOSIS — M79601 Pain in right arm: Secondary | ICD-10-CM

## 2017-12-27 DIAGNOSIS — N400 Enlarged prostate without lower urinary tract symptoms: Secondary | ICD-10-CM | POA: Diagnosis present

## 2017-12-27 DIAGNOSIS — Z888 Allergy status to other drugs, medicaments and biological substances status: Secondary | ICD-10-CM

## 2017-12-27 DIAGNOSIS — Z23 Encounter for immunization: Secondary | ICD-10-CM

## 2017-12-27 DIAGNOSIS — C7B04 Secondary carcinoid tumors of peritoneum: Secondary | ICD-10-CM | POA: Diagnosis present

## 2017-12-27 DIAGNOSIS — Z7982 Long term (current) use of aspirin: Secondary | ICD-10-CM

## 2017-12-27 DIAGNOSIS — M4802 Spinal stenosis, cervical region: Secondary | ICD-10-CM | POA: Diagnosis present

## 2017-12-27 DIAGNOSIS — G4733 Obstructive sleep apnea (adult) (pediatric): Secondary | ICD-10-CM | POA: Diagnosis present

## 2017-12-27 DIAGNOSIS — R29898 Other symptoms and signs involving the musculoskeletal system: Secondary | ICD-10-CM | POA: Diagnosis present

## 2017-12-27 DIAGNOSIS — C7A019 Malignant carcinoid tumor of the small intestine, unspecified portion: Secondary | ICD-10-CM | POA: Diagnosis present

## 2017-12-27 HISTORY — DX: Secondary carcinoid tumors, unspecified site: C7B.00

## 2017-12-27 HISTORY — DX: Obstructive sleep apnea (adult) (pediatric): G47.33

## 2017-12-27 HISTORY — DX: Type 2 diabetes mellitus without complications: E11.9

## 2017-12-27 LAB — CBC
HEMATOCRIT: 51.5 % (ref 39.0–52.0)
Hemoglobin: 17.5 g/dL — ABNORMAL HIGH (ref 13.0–17.0)
MCH: 31.4 pg (ref 26.0–34.0)
MCHC: 34 g/dL (ref 30.0–36.0)
MCV: 92.5 fL (ref 78.0–100.0)
PLATELETS: 96 10*3/uL — AB (ref 150–400)
RBC: 5.57 MIL/uL (ref 4.22–5.81)
RDW: 14.6 % (ref 11.5–15.5)
WBC: 8.3 10*3/uL (ref 4.0–10.5)

## 2017-12-27 LAB — DIFFERENTIAL
Abs Immature Granulocytes: 0.1 10*3/uL (ref 0.0–0.1)
Basophils Absolute: 0 10*3/uL (ref 0.0–0.1)
Basophils Relative: 1 %
Eosinophils Absolute: 0.1 10*3/uL (ref 0.0–0.7)
Eosinophils Relative: 2 %
IMMATURE GRANULOCYTES: 1 %
LYMPHS ABS: 1.2 10*3/uL (ref 0.7–4.0)
LYMPHS PCT: 14 %
MONOS PCT: 6 %
Monocytes Absolute: 0.5 10*3/uL (ref 0.1–1.0)
NEUTROS ABS: 6.4 10*3/uL (ref 1.7–7.7)
Neutrophils Relative %: 76 %

## 2017-12-27 LAB — COMPREHENSIVE METABOLIC PANEL
ALBUMIN: 4 g/dL (ref 3.5–5.0)
ALK PHOS: 86 U/L (ref 38–126)
ALT: 31 U/L (ref 0–44)
AST: 32 U/L (ref 15–41)
Anion gap: 10 (ref 5–15)
BUN: 14 mg/dL (ref 8–23)
CALCIUM: 9.4 mg/dL (ref 8.9–10.3)
CO2: 25 mmol/L (ref 22–32)
CREATININE: 1.17 mg/dL (ref 0.61–1.24)
Chloride: 98 mmol/L (ref 98–111)
GFR calc Af Amer: >60 mL/min (ref >60)
GFR calc non Af Amer: >60 mL/min (ref >60)
Glucose, Bld: 155 mg/dL — ABNORMAL HIGH (ref 70–99)
Potassium: 3.6 mmol/L (ref 3.5–5.1)
Sodium: 133 mmol/L — ABNORMAL LOW (ref 135–145)
Total Bilirubin: 1 mg/dL (ref 0.3–1.2)
Total Protein: 6.9 g/dL (ref 6.5–8.1)

## 2017-12-27 LAB — APTT: aPTT: 28 seconds (ref 24–36)

## 2017-12-27 LAB — I-STAT TROPONIN, ED: Troponin i, poc: 0 ng/mL (ref 0.00–0.08)

## 2017-12-27 LAB — PROTIME-INR
INR: 1
PROTHROMBIN TIME: 13.1 s (ref 11.4–15.2)

## 2017-12-27 LAB — CBG MONITORING, ED: Glucose-Capillary: 168 mg/dL — ABNORMAL HIGH (ref 70–99)

## 2017-12-27 MED ORDER — METFORMIN HCL ER 500 MG PO TB24: 500.0000 mg | ORAL_TABLET | Freq: Every day | ORAL | Status: DC

## 2017-12-27 MED ORDER — FAMOTIDINE 20 MG PO TABS
20.0000 mg | ORAL_TABLET | Freq: Every day | ORAL | Status: DC
  Administered 2017-12-28 – 2017-12-29 (×3): 20 mg via ORAL
  Filled 2017-12-27 (×3): qty 1

## 2017-12-27 MED ORDER — ONDANSETRON HCL 4 MG PO TABS: 4.0000 mg | ORAL_TABLET | Freq: Four times a day (QID) | ORAL | Status: DC | PRN

## 2017-12-27 MED ORDER — ROPINIROLE HCL 1 MG PO TABS
1.0000 mg | ORAL_TABLET | Freq: Every day | ORAL | Status: DC
  Administered 2017-12-28 – 2017-12-29 (×3): 1 mg via ORAL
  Filled 2017-12-27 (×3): qty 1

## 2017-12-27 MED ORDER — INSULIN ASPART 100 UNIT/ML ~~LOC~~ SOLN
0.0000 [IU] | Freq: Three times a day (TID) | SUBCUTANEOUS | Status: DC
  Administered 2017-12-28 – 2017-12-30 (×8): 5 [IU] via SUBCUTANEOUS
  Administered 2017-12-30: 3 [IU] via SUBCUTANEOUS

## 2017-12-27 MED ORDER — FAMOTIDINE-CA CARB-MAG HYDROX 10-800-165 MG PO CHEW: 1.0000 | CHEWABLE_TABLET | Freq: Every day | ORAL | Status: DC

## 2017-12-27 MED ORDER — ASPIRIN EC 81 MG PO TBEC
81.0000 mg | DELAYED_RELEASE_TABLET | Freq: Every day | ORAL | Status: DC
  Administered 2017-12-27 – 2017-12-29 (×3): 81 mg via ORAL
  Filled 2017-12-27 (×3): qty 1

## 2017-12-27 MED ORDER — MORPHINE SULFATE (PF) 2 MG/ML IV SOLN
2.0000 mg | INTRAVENOUS | Status: DC | PRN
  Filled 2017-12-27: qty 2

## 2017-12-27 MED ORDER — ENOXAPARIN SODIUM 40 MG/0.4ML ~~LOC~~ SOLN
40.0000 mg | SUBCUTANEOUS | Status: DC
  Administered 2017-12-28 – 2017-12-30 (×3): 40 mg via SUBCUTANEOUS
  Filled 2017-12-27 (×4): qty 0.4

## 2017-12-27 MED ORDER — MORPHINE SULFATE (PF) 4 MG/ML IV SOLN
4.0000 mg | Freq: Once | INTRAVENOUS | Status: AC
  Administered 2017-12-27: 4 mg via INTRAVENOUS
  Filled 2017-12-27: qty 1

## 2017-12-27 MED ORDER — ONDANSETRON HCL 4 MG/2ML IJ SOLN: 4.0000 mg | Freq: Four times a day (QID) | INTRAMUSCULAR | Status: DC | PRN

## 2017-12-27 MED ORDER — ACETAMINOPHEN 650 MG RE SUPP: 650.0000 mg | Freq: Four times a day (QID) | RECTAL | Status: DC | PRN

## 2017-12-27 MED ORDER — DEXAMETHASONE SODIUM PHOSPHATE 10 MG/ML IJ SOLN
10.0000 mg | Freq: Once | INTRAMUSCULAR | Status: AC
  Administered 2017-12-27: 10 mg via INTRAVENOUS
  Filled 2017-12-27: qty 1

## 2017-12-27 MED ORDER — IOHEXOL 300 MG/ML  SOLN
75.0000 mL | Freq: Once | INTRAMUSCULAR | Status: AC | PRN
  Administered 2017-12-27: 75 mL via INTRAVENOUS

## 2017-12-27 MED ORDER — ATENOLOL 50 MG PO TABS
50.0000 mg | ORAL_TABLET | Freq: Every day | ORAL | Status: DC
  Administered 2017-12-28 – 2017-12-30 (×3): 50 mg via ORAL
  Filled 2017-12-27 (×3): qty 1

## 2017-12-27 MED ORDER — DIPHENOXYLATE-ATROPINE 2.5-0.025 MG PO TABS
1.0000 | ORAL_TABLET | Freq: Every day | ORAL | Status: DC
  Administered 2017-12-28 – 2017-12-30 (×3): 1 via ORAL
  Filled 2017-12-27 (×3): qty 1

## 2017-12-27 MED ORDER — ACETAMINOPHEN 325 MG PO TABS: 650.0000 mg | ORAL_TABLET | Freq: Four times a day (QID) | ORAL | Status: DC | PRN

## 2017-12-27 MED ORDER — PRAVASTATIN SODIUM 10 MG PO TABS
20.0000 mg | ORAL_TABLET | Freq: Every day | ORAL | Status: DC
  Administered 2017-12-28 – 2017-12-29 (×3): 20 mg via ORAL
  Filled 2017-12-27: qty 2
  Filled 2017-12-27: qty 1
  Filled 2017-12-27: qty 2

## 2017-12-27 MED ORDER — DICLOFENAC SODIUM 1 % TD GEL
1.0000 "application " | Freq: Three times a day (TID) | TRANSDERMAL | Status: DC | PRN
  Filled 2017-12-27: qty 100

## 2017-12-27 MED ORDER — CHOLESTYRAMINE LIGHT 4 G PO PACK
4.0000 g | PACK | Freq: Every day | ORAL | Status: DC
  Administered 2017-12-28 – 2017-12-30 (×3): 4 g via ORAL
  Filled 2017-12-27 (×5): qty 1

## 2017-12-27 MED ORDER — TRIAMTERENE-HCTZ 75-50 MG PO TABS
1.0000 | ORAL_TABLET | Freq: Every day | ORAL | Status: DC
  Administered 2017-12-28 – 2017-12-30 (×3): 1 via ORAL
  Filled 2017-12-27 (×3): qty 1

## 2017-12-27 MED ORDER — ACETAMINOPHEN 500 MG PO TABS
500.0000 mg | ORAL_TABLET | Freq: Two times a day (BID) | ORAL | Status: DC
  Administered 2017-12-27 – 2017-12-30 (×6): 500 mg via ORAL
  Filled 2017-12-27 (×6): qty 1

## 2017-12-27 MED ORDER — DILTIAZEM HCL ER COATED BEADS 120 MG PO CP24
120.0000 mg | ORAL_CAPSULE | Freq: Every day | ORAL | Status: DC
  Administered 2017-12-28 – 2017-12-29 (×3): 120 mg via ORAL
  Filled 2017-12-27 (×5): qty 1

## 2017-12-27 MED ORDER — ACETAMINOPHEN 325 MG PO TABS
650.0000 mg | ORAL_TABLET | Freq: Once | ORAL | Status: AC
  Administered 2017-12-27: 650 mg via ORAL
  Filled 2017-12-27: qty 2

## 2017-12-27 MED ORDER — DEXAMETHASONE SODIUM PHOSPHATE 10 MG/ML IJ SOLN
10.0000 mg | Freq: Four times a day (QID) | INTRAMUSCULAR | Status: DC
  Administered 2017-12-28 – 2017-12-29 (×7): 10 mg via INTRAVENOUS
  Filled 2017-12-27 (×7): qty 1

## 2017-12-27 NOTE — H&P (Signed)
History and Physical    Gregory Cook TDD:[id number] DOB: January 28, 1951 DOA: 12/27/2017  PCP: Patient, No Pcp Per  Patient coming from: Home  I have personally briefly reviewed patient's old medical records in Gregory Cook  Chief Complaint: R hand weakness, neck pain  HPI: Gregory Cook is a 67 y.o. 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  Patient presents to the ED with several day history of neck pain radiating down arm into first 3 fingers of R hand.  Pain is burning, constant, dysesthetic.  At 7 am this morning started developing significant weakness of his R hand.   ED Course: Seen by Dr. Saintclair Halsted.  MRI brain and C spine unrevealing as to cause of R hand pain / weakness.  See Dr. Windy Carina consult note for details.   Review of Systems: As per HPI otherwise 10 point review of systems negative.   Past Medical History:  Diagnosis Date  . DM2 (diabetes mellitus, type 2) ([place])   . Hypertension   . Liver metastasis ([place])   . Metastatic carcinoid tumor ([place])   . OSA (obstructive sleep apnea)   . Stroke [facility])     Past Surgical History:  Procedure Laterality Date  . CHOLECYSTECTOMY    . IR GENERIC HISTORICAL  05/07/2016   IR RADIOLOGIST EVAL & MGMT 05/07/2016 [name], MD [facility] INTERV RAD  . IR RADIOLOGIST EVAL & MGMT  10/29/2017     reports that he has never smoked. He has never used smokeless tobacco. He reports that he does not drink alcohol or use drugs.  Allergies  Allergen Reactions  . Nuvigil [Armodafinil] Swelling    Lip and throat swelling    Family History  Problem Relation Age of Onset  . Colon cancer Neg Hx      Prior to Admission medications   Medication Sig Start Date End Date Taking? Authorizing Provider  acetaminophen (TYLENOL) 500 MG tablet Take 500 mg by mouth 2 (two) times daily.    Yes [provider]  aspirin EC 81 MG tablet Take 81 mg by mouth at bedtime.   Yes [provider]  atenolol (TENORMIN) 50 MG tablet Take 50 mg by mouth daily.   Yes [provider]  butalbital-acetaminophen-caffeine (FIORICET, ESGIC) 50-325-40 MG per tablet Take 1 tablet by mouth every 4 (four) hours as needed for headache.   Yes [provider]  cholestyramine light (PREVALITE) 4 GM/DOSE powder Take 4 g by mouth daily after breakfast.    Yes [provider]  diclofenac sodium (VOLTAREN) 1 % GEL Apply 1 application topically 3 (three) times daily as needed (pain).    Yes [provider]  diltiazem (DILACOR XR) 120 MG 24 hr capsule Take 120 mg by mouth at bedtime.    Yes [provider]  diphenoxylate-atropine (LOMOTIL) 2.5-0.025 MG per tablet Take 1 tablet by mouth daily.    Yes [provider]  famotidine-calcium carbonate-magnesium hydroxide (PEPCID COMPLETE) 10-800-165 MG chewable tablet Chew 1 tablet by mouth at bedtime.   Yes [provider]  metFORMIN (GLUCOPHAGE-XR) 500 MG 24 hr tablet Take 500 mg by mouth daily after supper.  12/24/17  Yes [provider]  octreotide (SANDOSTATIN LAR) 30 MG injection Inject 60 mg into the muscle See admin instructions. Inject two doses (60 mg) intramuscularly (one injection in each butt cheek) - once every 28 days - last injection 12/23/17 - administered by [facility])   Yes [provider]  pravastatin (PRAVACHOL) 20 MG tablet Take 20 mg by mouth at bedtime.  12/24/17  Yes [provider]  rOPINIRole (REQUIP) 1 MG tablet Take 1 mg by mouth at bedtime.   Yes [provider]  sildenafil (REVATIO) 20 MG tablet Take 40-100 mg by mouth See admin instructions. Take 2-5 tablets (40-100 mg) by mouth 30-60 minutes prior to sexual activity   Yes [provider]  terazosin (HYTRIN) 10 MG capsule Take 10 mg by mouth at bedtime.   Yes [provider]  testosterone cypionate (DEPOTESTOSTERONE CYPIONATE) 200 MG/ML  injection Inject 80 mg into the muscle every 14 (fourteen) days. For IM use only    Yes [provider]  tiZANidine (ZANAFLEX) 2 MG tablet Take 2-6 mg by mouth every 8 (eight) hours as needed (shoulder pain).  12/26/17  Yes [provider]  triamterene-hydrochlorothiazide (MAXZIDE) 75-50 MG per tablet Take 1 tablet by mouth daily.    Yes [provider]    Physical Exam: Vitals:   12/27/17 2030 12/27/17 2100 12/27/17 2101 12/27/17 2130  BP: 119/73 134/78 134/78 125/78  Pulse: (!) 58 67 61 62  Resp: 17 (!) 24 20 (!) 22  Temp:      TempSrc:      SpO2: 94% 95% 96% 96%  Weight:      Height:        Constitutional: NAD, calm, comfortable Eyes: PERRL, lids and conjunctivae normal ENMT: Mucous membranes are moist. Posterior pharynx clear of any exudate or lesions.Normal dentition.  Neck: normal, supple, no masses, no thyromegaly Respiratory: clear to auscultation bilaterally, no wheezing, no crackles. Normal respiratory effort. No accessory muscle use.  Cardiovascular: Regular rate and rhythm, no murmurs / rubs / gallops. No extremity edema. 2+ pedal pulses. No carotid bruits.  Abdomen: no tenderness, no masses palpated. No hepatosplenomegaly. Bowel sounds positive.  Musculoskeletal: no clubbing / cyanosis. No joint deformity upper and lower extremities. Good ROM, no contractures. Normal muscle tone.  Skin: no rashes, lesions, ulcers. No induration Neurologic: R hand weakness in intrinsic muscles, inability to make fist Psychiatric: Normal judgment and insight. Alert and oriented x 3. Normal mood.    Labs on Admission: I have personally reviewed following labs and imaging studies  CBC: Recent Labs  Lab 12/27/17 1510  WBC 8.3  NEUTROABS 6.4  HGB 17.5*  HCT 51.5  MCV 92.5  PLT 96*   Basic Metabolic Panel: Recent Labs  Lab 12/27/17 1510  NA 133*  K 3.6  CL 98  CO2 25  GLUCOSE 155*  BUN 14  CREATININE 1.17  CALCIUM 9.4   GFR: Estimated  Creatinine Clearance: 72.6 mL/min (by C-G formula based on SCr of 1.17 mg/dL). Liver Function Tests: Recent Labs  Lab 12/27/17 1510  AST 32  ALT 31  ALKPHOS 86  BILITOT 1.0  PROT 6.9  ALBUMIN 4.0   No results for input(s): LIPASE, AMYLASE in the last 168 hours. No results for input(s): AMMONIA in the last 168 hours. Coagulation Profile: Recent Labs  Lab 12/27/17 1510  INR 1.00   Cardiac Enzymes: No results for input(s): CKTOTAL, CKMB, CKMBINDEX, TROPONINI in the last 168 hours. BNP (last 3 results) No results for input(s): PROBNP in the last 8760 hours. HbA1C: No results for input(s): HGBA1C in the last 72 hours. CBG: Recent Labs  Lab 12/27/17 1502  GLUCAP 168*   Lipid Profile: No results for input(s): CHOL, HDL, LDLCALC, TRIG, CHOLHDL, LDLDIRECT in the last  72 hours. Thyroid Function Tests: No results for input(s): TSH, T4TOTAL, FREET4, T3FREE, THYROIDAB in the last 72 hours. Anemia Panel: No results for input(s): VITAMINB12, FOLATE, FERRITIN, TIBC, IRON, RETICCTPCT in the last 72 hours. Urine analysis: No results found for: COLORURINE, APPEARANCEUR, LABSPEC, [place], GLUCOSEU, HGBUR, BILIRUBINUR, KETONESUR, PROTEINUR, UROBILINOGEN, NITRITE, LEUKOCYTESUR  Radiological Exams on Admission: Ct Head Wo Contrast  Result Date: 12/27/2017 CLINICAL DATA:  Status post fall. EXAM: CT HEAD WITHOUT CONTRAST TECHNIQUE: Contiguous axial images were obtained from the base of the skull through the vertex without intravenous contrast. COMPARISON:  None. FINDINGS: Brain: 7 mm hyperattenuating lesion is identified in the inferior left frontal lobe, well seen on coronal image 22 of series 5. No obvious surrounding edema. No evidence for hydrocephalus, acute infarction, or midline shift. Vascular: No hyperdense vessel or unexpected calcification. Skull: No evidence for fracture. No worrisome lytic or sclerotic lesion. Sinuses/Orbits: The visualized paranasal sinuses and mastoid air cells are  clear. Visualized portions of the globes and intraorbital fat are unremarkable. Other: None. IMPRESSION: 1. 7 mm subtle hyperattenuating lesion in the inferior left frontal lobe. This is not well characterized by CT but may be a meningioma or vascular malformation. Small hemorrhagic focus also possibility but considered less likely. MRI of the brain recommended to further evaluate. 2. Otherwise no acute finding. Electronically Signed   By: Misty Stanley M.D.   On: 12/27/2017 15:43   Mr Brain Wo Contrast (neuro Protocol)  Result Date: 12/27/2017 CLINICAL DATA:  Right hand weakness, headache, and dizziness. Fall. Neck and right shoulder pain. EXAM: MRI HEAD WITHOUT CONTRAST MRI CERVICAL SPINE WITHOUT CONTRAST TECHNIQUE: Multiplanar, multiecho pulse sequences of the brain and surrounding structures, and cervical spine, to include the craniocervical junction and cervicothoracic junction, were obtained without intravenous contrast. COMPARISON:  Head CT 12/27/2017 FINDINGS: MRI HEAD FINDINGS Brain: There is no evidence of acute infarct, intracranial hemorrhage, midline shift, or extra-axial fluid collection. There appears to be a round 7 mm T1 hypointense extra-axial nodule inferior to the left frontal lobe in the anterior cranial fossa likely corresponding to the subtle density on CT without significant associated mass effect or brain edema. No definite hemorrhage is evident in this location. The ventricles and sulci are within normal limits for age. Scattered small foci of T2 hyperintensity in the cerebral white matter and pons are nonspecific but compatible with mild chronic small vessel ischemic disease. Vascular: Major intracranial vascular flow voids are preserved. Skull and upper cervical spine: Unremarkable bone marrow signal. Sinuses/Orbits: Unremarkable orbits. Clear paranasal sinuses. No significant mastoid fluid. Other: None. MRI CERVICAL SPINE FINDINGS Alignment: Cervical spine straightening.  No  listhesis. Vertebrae: No fracture, suspicious osseous lesion, or significant marrow edema. Mild chronic degenerative endplate changes at D7-8 associated with moderate to severe disc space narrowing. Moderate disc space narrowing at C7-T1 and mild narrowing at C3-4 and C4-5. Mild diffuse spinal canal narrowing on a congenital basis. Cord: Normal signal. Posterior Fossa, vertebral arteries, paraspinal tissues: Unremarkable. Disc levels: C2-3: Mild left uncovertebral spurring without significant stenosis. C3-4: Disc bulging, left greater than right uncovertebral spurring, and moderate left facet arthrosis result in mild spinal stenosis and moderate to severe left neural foraminal stenosis. C4-5: Broad right paracentral disc protrusion and uncovertebral spurring result in mild spinal stenosis and mild bilateral neural foraminal stenosis. C5-6: Disc bulging results in moderate spinal stenosis without neural foraminal stenosis. C6-7: A broad central disc protrusion results in moderate spinal stenosis with slight cord flattening. Uncovertebral spurring results in borderline bilateral neural  foraminal stenosis. C7-T1: Right greater than left uncovertebral spurring without significant stenosis. T1-2: Only imaged sagittally. Disc bulging/bilateral foraminal disc protrusions result in mild-to-moderate bilateral neural foraminal stenosis without spinal stenosis. IMPRESSION: 1. No acute intracranial abnormality. 2. 7 mm extra-axial nodule along the floor of the left anterior cranial fossa suspicious for a small meningioma. Nonemergent postcontrast brain MRI is recommended for further evaluation. 3. Mild chronic small vessel ischemic disease. 4. Cervical disc degeneration greatest at C6-7 where there is moderate spinal stenosis with slight cord flattening. 5. Moderate spinal stenosis at C5-6 and mild spinal stenosis at C3-4 and C4-5. 6. Moderate to severe left neural foraminal stenosis at C3-4. Electronically Signed   By: Logan Bores M.D.   On: 12/27/2017 19:03   Mr Cervical Spine Wo Contrast  Result Date: 12/27/2017 CLINICAL DATA:  Right hand weakness, headache, and dizziness. Fall. Neck and right shoulder pain. EXAM: MRI HEAD WITHOUT CONTRAST MRI CERVICAL SPINE WITHOUT CONTRAST TECHNIQUE: Multiplanar, multiecho pulse sequences of the brain and surrounding structures, and cervical spine, to include the craniocervical junction and cervicothoracic junction, were obtained without intravenous contrast. COMPARISON:  Head CT 12/27/2017 FINDINGS: MRI HEAD FINDINGS Brain: There is no evidence of acute infarct, intracranial hemorrhage, midline shift, or extra-axial fluid collection. There appears to be a round 7 mm T1 hypointense extra-axial nodule inferior to the left frontal lobe in the anterior cranial fossa likely corresponding to the subtle density on CT without significant associated mass effect or brain edema. No definite hemorrhage is evident in this location. The ventricles and sulci are within normal limits for age. Scattered small foci of T2 hyperintensity in the cerebral white matter and pons are nonspecific but compatible with mild chronic small vessel ischemic disease. Vascular: Major intracranial vascular flow voids are preserved. Skull and upper cervical spine: Unremarkable bone marrow signal. Sinuses/Orbits: Unremarkable orbits. Clear paranasal sinuses. No significant mastoid fluid. Other: None. MRI CERVICAL SPINE FINDINGS Alignment: Cervical spine straightening.  No listhesis. Vertebrae: No fracture, suspicious osseous lesion, or significant marrow edema. Mild chronic degenerative endplate changes at W9-6 associated with moderate to severe disc space narrowing. Moderate disc space narrowing at C7-T1 and mild narrowing at C3-4 and C4-5. Mild diffuse spinal canal narrowing on a congenital basis. Cord: Normal signal. Posterior Fossa, vertebral arteries, paraspinal tissues: Unremarkable. Disc levels: C2-3: Mild left uncovertebral  spurring without significant stenosis. C3-4: Disc bulging, left greater than right uncovertebral spurring, and moderate left facet arthrosis result in mild spinal stenosis and moderate to severe left neural foraminal stenosis. C4-5: Broad right paracentral disc protrusion and uncovertebral spurring result in mild spinal stenosis and mild bilateral neural foraminal stenosis. C5-6: Disc bulging results in moderate spinal stenosis without neural foraminal stenosis. C6-7: A broad central disc protrusion results in moderate spinal stenosis with slight cord flattening. Uncovertebral spurring results in borderline bilateral neural foraminal stenosis. C7-T1: Right greater than left uncovertebral spurring without significant stenosis. T1-2: Only imaged sagittally. Disc bulging/bilateral foraminal disc protrusions result in mild-to-moderate bilateral neural foraminal stenosis without spinal stenosis. IMPRESSION: 1. No acute intracranial abnormality. 2. 7 mm extra-axial nodule along the floor of the left anterior cranial fossa suspicious for a small meningioma. Nonemergent postcontrast brain MRI is recommended for further evaluation. 3. Mild chronic small vessel ischemic disease. 4. Cervical disc degeneration greatest at C6-7 where there is moderate spinal stenosis with slight cord flattening. 5. Moderate spinal stenosis at C5-6 and mild spinal stenosis at C3-4 and C4-5. 6. Moderate to severe left neural foraminal stenosis at C3-4. Electronically  Signed   By: Logan Bores M.D.   On: 12/27/2017 19:03    EKG: Independently reviewed.  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 R hand pain and weakness - 1. MRI brain and C spine neg for source 2. See Dr. Saintclair Halsted consult note 1. Decadron 10mg  Q6H 2. CT chest  including the R shoulder and brachial plexus area is being performed right now to eval for brachial plexus lesion 3. Consider acyclovir for possible shingles? But will hold off ordering for the moment pending the CT. (No rash, and would be somewhat unusual for shingles to present as muscle weakness.) 2. DM2 - 1. Hold metformin 2. Mod scale SSI AC 3. HTN - continue home BP meds 4. Metastatic carcinoid - continue home meds, CT chest pending as above  DVT prophylaxis: Lovenox Code Status: Full Family Communication: Wife at bedside Disposition Plan: Home after admit Consults called: NS Dr. Saintclair Halsted Admission status: Place in [place], [place] M. DO [facility] Pager [phone] Only works nights!  If 7AM-7PM, please contact the primary day team physician taking care of patient  [email or web address] Password [email or web address]  12/27/2017, 10:09 PM

## 2017-12-27 NOTE — ED Provider Notes (Addendum)
[facility] EMERGENCY DEPARTMENT Provider Note   CSN: [id number] Arrival date & time: 12/27/17  1451     History   Chief Complaint Chief Complaint  Patient presents with  . Weakness  . Headache    HPI Gregory Cook is a 67 y.o. male.  67 year old male with prior medical history as detailed below presents with complaint of right-sided arm weakness.  Patient reports several days of right-sided upper back posterior shoulder pain.  This is become worse over the last 24 hours.  This morning he noted decreased strength in his right hand with attempted grip.  He denies weakness in his right elbow right shoulder.  He also reports associated dizziness - perhaps secondary to his pain.  He denies chest pain or shortness of breath.  He denies any significant recent injury.  He denies fever.  He denies rash.  The history is provided by the patient and medical records.  Weakness  Primary symptoms include focal weakness, dizziness.  Primary symptoms include no loss of balance. This is a new problem. The current episode started more than 2 days ago. The problem has not changed since onset.There was right upper extremity focality noted. There has been no fever. Associated symptoms include headaches. Pertinent negatives include no shortness of breath and no chest pain.  Headache   Pertinent negatives include no shortness of breath.    Past Medical History:  Diagnosis Date  . Hypertension   . Liver metastasis ([place])   . Stroke [facility])     Patient Active Problem List   Diagnosis Date Noted  . Complete rotator cuff rupture of left shoulder 04/21/2015  . Diabetes mellitus ([place]) 04/03/2015  . Essential (primary) hypertension 04/03/2015  . Gastro-esophageal reflux disease without esophagitis 04/03/2015  . History of cerebrovascular accident with residual deficit 04/03/2015  . 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 Muscle contraction  headache 04/03/2015  . Adult BMI 30+ 04/03/2015  . Arthropathy, traumatic, knee 04/03/2015  . Restless leg 04/03/2015  . Malignant neoplasm metastatic to liver ([place]) 03/08/2015  . Chest pressure 02/13/2015  . Fast heart beat 02/13/2015  . Apnea, sleep 02/13/2015  . Breath shortness 02/13/2015  . Loss of feeling or sensation 02/13/2015  . BP (high blood pressure) 02/13/2015  . D (diarrhea) 02/13/2015  . Arthritis 02/13/2015  . 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 Liver cancer, primary, with metastasis from liver to other site [facility])   . 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 HLD (hyperlipidemia) 07/13/2012  . Cerebral infarction ([place]) 07/13/2012    Past Surgical History:  Procedure Laterality Date  . IR GENERIC HISTORICAL  05/07/2016   IR RADIOLOGIST EVAL & MGMT 05/07/2016 [name], MD [facility] INTERV RAD  . IR RADIOLOGIST EVAL & MGMT  10/29/2017        Home Medications    Prior to Admission medications   Medication Sig Start Date End Date Taking? Authorizing Provider  acetaminophen (TYLENOL) 500 MG tablet Take 500 mg by mouth 2 (two) times daily.    Yes [provider]  aspirin EC 81 MG tablet Take 81 mg by mouth at bedtime.   Yes [provider]  atenolol (TENORMIN) 50 MG tablet Take 50 mg by mouth daily.   Yes [provider]  butalbital-acetaminophen-caffeine (FIORICET, ESGIC) 50-325-40 MG per tablet Take 1 tablet by mouth every 4 (four) hours as needed for headache.  Yes [provider]  cholestyramine light (PREVALITE) 4 GM/DOSE powder Take 4 g by mouth daily after breakfast.    Yes [provider]  diclofenac sodium (VOLTAREN) 1 % GEL Apply 1 application topically 3 (three) times daily as needed (pain).    Yes [provider]  diltiazem (DILACOR XR) 120 MG 24 hr capsule Take 120 mg by mouth at bedtime.    Yes [provider]    diphenoxylate-atropine (LOMOTIL) 2.5-0.025 MG per tablet Take 1 tablet by mouth daily.    Yes [provider]  famotidine-calcium carbonate-magnesium hydroxide (PEPCID COMPLETE) 10-800-165 MG chewable tablet Chew 1 tablet by mouth at bedtime.   Yes [provider]  metFORMIN (GLUCOPHAGE-XR) 500 MG 24 hr tablet Take 500 mg by mouth daily after supper.  12/24/17  Yes [provider]  octreotide (SANDOSTATIN LAR) 30 MG injection Inject 60 mg into the muscle See admin instructions. Inject two doses (60 mg) intramuscularly (one injection in each butt cheek) - once every 28 days - last injection 12/23/17 - [facility] by [facility])   Yes [provider]  pravastatin (PRAVACHOL) 20 MG tablet Take 20 mg by mouth at bedtime.  12/24/17  Yes [provider]  rOPINIRole (REQUIP) 1 MG tablet Take 1 mg by mouth at bedtime.   Yes [provider]  sildenafil (REVATIO) 20 MG tablet Take 40-100 mg by mouth See admin instructions. Take 2-5 tablets (40-100 mg) by mouth 30-60 minutes prior to sexual activity   Yes [provider]  terazosin (HYTRIN) 10 MG capsule Take 10 mg by mouth at bedtime.   Yes [provider]  testosterone cypionate (DEPOTESTOSTERONE CYPIONATE) 200 MG/ML injection Inject 80 mg into the muscle every 14 (fourteen) days. For IM use only    Yes [provider]  tiZANidine (ZANAFLEX) 2 MG tablet Take 2-6 mg by mouth every 8 (eight) hours as needed (shoulder pain).  12/26/17  Yes [provider]  triamterene-hydrochlorothiazide (MAXZIDE) 75-50 MG per tablet Take 1 tablet by mouth daily.    Yes [provider]    Family History No family history on file.  Social History Social History   Tobacco Use  . Smoking status: Never Smoker  . Smokeless tobacco: Never Used  Substance Use Topics  . Alcohol use: No    Alcohol/week: 0.0 standard drinks  . Drug use: No      Allergies   Nuvigil [armodafinil]   Review of Systems Review of Systems  Respiratory: Negative for shortness of breath.   Cardiovascular: Negative for chest pain.  Neurological: Positive for dizziness, focal weakness, weakness and headaches. Negative for loss of balance.  All other systems reviewed and are negative.    Physical Exam Updated Vital Signs BP 134/78   Pulse 61   Temp 97.8 F (36.6 C)   Resp 20   Ht 5\' 10"  (1.778 m)   Wt 105.7 kg   SpO2 96%   BMI 33.43 kg/m   Physical Exam  Constitutional: He is oriented to person, place, and time. He appears well-developed and well-nourished. No distress.  HENT:  Head: Normocephalic and atraumatic.  Mouth/Throat: Oropharynx is clear and moist.  Eyes: Pupils are equal, round, and reactive to light. Conjunctivae and EOM are normal.  Neck: Normal range of motion. Neck supple.  Cardiovascular: Normal rate, regular rhythm and normal heart sounds.  Pulmonary/Chest: Effort normal and breath sounds normal. No respiratory distress.  Abdominal: Soft. He exhibits no distension. There  is no tenderness.  Musculoskeletal: Normal range of motion. He exhibits no edema or deformity.  Neurological: He is alert and oriented to person, place, and time.  Right shoulder with full active range of motion.  Right elbow with full active range of motion.  Right grip is weak.  Right hand with claw grip.  Patient is reporting decreased sensation to both upper extremities mostly in the fourth and fifth digits of each hand.  Skin: Skin is warm and dry.  Psychiatric: He has a normal mood and affect.  Nursing note and vitals reviewed.    ED Treatments / Results  Labs (all labs ordered are listed, but only abnormal results are displayed) Labs Reviewed  CBC - Abnormal; Notable for the following components:      Result Value   Hemoglobin 17.5 (*)    Platelets 96 (*)    All other components within normal limits  COMPREHENSIVE METABOLIC PANEL -  Abnormal; Notable for the following components:   Sodium 133 (*)    Glucose, Bld 155 (*)    All other components within normal limits  CBG MONITORING, ED - Abnormal; Notable for the following components:   Glucose-Capillary 168 (*)    All other components within normal limits  PROTIME-INR  APTT  DIFFERENTIAL  I-STAT TROPONIN, ED  CBG MONITORING, ED    EKG 12/27/2017 Sinus brady, HR 52, No acute ST elevation or ischemic changes   Radiology Ct Head Wo Contrast  Result Date: 12/27/2017 CLINICAL DATA:  Status post fall. EXAM: CT HEAD WITHOUT CONTRAST TECHNIQUE: Contiguous axial images were obtained from the base of the skull through the vertex without intravenous contrast. COMPARISON:  None. FINDINGS: Brain: 7 mm hyperattenuating lesion is identified in the inferior left frontal lobe, well seen on coronal image 22 of series 5. No obvious surrounding edema. No evidence for hydrocephalus, acute infarction, or midline shift. Vascular: No hyperdense vessel or unexpected calcification. Skull: No evidence for fracture. No worrisome lytic or sclerotic lesion. Sinuses/Orbits: The visualized paranasal sinuses and mastoid air cells are clear. Visualized portions of the globes and intraorbital fat are unremarkable. Other: None. IMPRESSION: 1. 7 mm subtle hyperattenuating lesion in the inferior left frontal lobe. This is not well characterized by CT but may be a meningioma or vascular malformation. Small hemorrhagic focus also possibility but considered less likely. MRI of the brain recommended to further evaluate. 2. Otherwise no acute finding. Electronically Signed   By: Misty Stanley M.D.   On: 12/27/2017 15:43   Mr Brain Wo Contrast (neuro Protocol)  Result Date: 12/27/2017 CLINICAL DATA:  Right hand weakness, headache, and dizziness. Fall. Neck and right shoulder pain. EXAM: MRI HEAD WITHOUT CONTRAST MRI CERVICAL SPINE WITHOUT CONTRAST TECHNIQUE: Multiplanar, multiecho pulse sequences of the brain and  surrounding structures, and cervical spine, to include the craniocervical junction and cervicothoracic junction, were obtained without intravenous contrast. COMPARISON:  Head CT 12/27/2017 FINDINGS: MRI HEAD FINDINGS Brain: There is no evidence of acute infarct, intracranial hemorrhage, midline shift, or extra-axial fluid collection. There appears to be a round 7 mm T1 hypointense extra-axial nodule inferior to the left frontal lobe in the anterior cranial fossa likely corresponding to the subtle density on CT without significant associated mass effect or brain edema. No definite hemorrhage is evident in this location. The ventricles and sulci are within normal limits for age. Scattered small foci of T2 hyperintensity in the cerebral white matter and pons are nonspecific but compatible with mild chronic small vessel ischemic disease. Vascular:  Major intracranial vascular flow voids are preserved. Skull and upper cervical spine: Unremarkable bone marrow signal. Sinuses/Orbits: Unremarkable orbits. Clear paranasal sinuses. No significant mastoid fluid. Other: None. MRI CERVICAL SPINE FINDINGS Alignment: Cervical spine straightening.  No listhesis. Vertebrae: No fracture, suspicious osseous lesion, or significant marrow edema. Mild chronic degenerative endplate changes at I2-9 associated with moderate to severe disc space narrowing. Moderate disc space narrowing at C7-T1 and mild narrowing at C3-4 and C4-5. Mild diffuse spinal canal narrowing on a congenital basis. Cord: Normal signal. Posterior Fossa, vertebral arteries, paraspinal tissues: Unremarkable. Disc levels: C2-3: Mild left uncovertebral spurring without significant stenosis. C3-4: Disc bulging, left greater than right uncovertebral spurring, and moderate left facet arthrosis result in mild spinal stenosis and moderate to severe left neural foraminal stenosis. C4-5: Broad right paracentral disc protrusion and uncovertebral spurring result in mild spinal  stenosis and mild bilateral neural foraminal stenosis. C5-6: Disc bulging results in moderate spinal stenosis without neural foraminal stenosis. C6-7: A broad central disc protrusion results in moderate spinal stenosis with slight cord flattening. Uncovertebral spurring results in borderline bilateral neural foraminal stenosis. C7-T1: Right greater than left uncovertebral spurring without significant stenosis. T1-2: Only imaged sagittally. Disc bulging/bilateral foraminal disc protrusions result in mild-to-moderate bilateral neural foraminal stenosis without spinal stenosis. IMPRESSION: 1. No acute intracranial abnormality. 2. 7 mm extra-axial nodule along the floor of the left anterior cranial fossa suspicious for a small meningioma. Nonemergent postcontrast brain MRI is recommended for further evaluation. 3. Mild chronic small vessel ischemic disease. 4. Cervical disc degeneration greatest at C6-7 where there is moderate spinal stenosis with slight cord flattening. 5. Moderate spinal stenosis at C5-6 and mild spinal stenosis at C3-4 and C4-5. 6. Moderate to severe left neural foraminal stenosis at C3-4. Electronically Signed   By: Logan Bores M.D.   On: 12/27/2017 19:03   Mr Cervical Spine Wo Contrast  Result Date: 12/27/2017 CLINICAL DATA:  Right hand weakness, headache, and dizziness. Fall. Neck and right shoulder pain. EXAM: MRI HEAD WITHOUT CONTRAST MRI CERVICAL SPINE WITHOUT CONTRAST TECHNIQUE: Multiplanar, multiecho pulse sequences of the brain and surrounding structures, and cervical spine, to include the craniocervical junction and cervicothoracic junction, were obtained without intravenous contrast. COMPARISON:  Head CT 12/27/2017 FINDINGS: MRI HEAD FINDINGS Brain: There is no evidence of acute infarct, intracranial hemorrhage, midline shift, or extra-axial fluid collection. There appears to be a round 7 mm T1 hypointense extra-axial nodule inferior to the left frontal lobe in the anterior cranial  fossa likely corresponding to the subtle density on CT without significant associated mass effect or brain edema. No definite hemorrhage is evident in this location. The ventricles and sulci are within normal limits for age. Scattered small foci of T2 hyperintensity in the cerebral white matter and pons are nonspecific but compatible with mild chronic small vessel ischemic disease. Vascular: Major intracranial vascular flow voids are preserved. Skull and upper cervical spine: Unremarkable bone marrow signal. Sinuses/Orbits: Unremarkable orbits. Clear paranasal sinuses. No significant mastoid fluid. Other: None. MRI CERVICAL SPINE FINDINGS Alignment: Cervical spine straightening.  No listhesis. Vertebrae: No fracture, suspicious osseous lesion, or significant marrow edema. Mild chronic degenerative endplate changes at N9-8 associated with moderate to severe disc space narrowing. Moderate disc space narrowing at C7-T1 and mild narrowing at C3-4 and C4-5. Mild diffuse spinal canal narrowing on a congenital basis. Cord: Normal signal. Posterior Fossa, vertebral arteries, paraspinal tissues: Unremarkable. Disc levels: C2-3: Mild left uncovertebral spurring without significant stenosis. C3-4: Disc bulging, left greater than right uncovertebral  spurring, and moderate left facet arthrosis result in mild spinal stenosis and moderate to severe left neural foraminal stenosis. C4-5: Broad right paracentral disc protrusion and uncovertebral spurring result in mild spinal stenosis and mild bilateral neural foraminal stenosis. C5-6: Disc bulging results in moderate spinal stenosis without neural foraminal stenosis. C6-7: A broad central disc protrusion results in moderate spinal stenosis with slight cord flattening. Uncovertebral spurring results in borderline bilateral neural foraminal stenosis. C7-T1: Right greater than left uncovertebral spurring without significant stenosis. T1-2: Only imaged sagittally. Disc bulging/bilateral  foraminal disc protrusions result in mild-to-moderate bilateral neural foraminal stenosis without spinal stenosis. IMPRESSION: 1. No acute intracranial abnormality. 2. 7 mm extra-axial nodule along the floor of the left anterior cranial fossa suspicious for a small meningioma. Nonemergent postcontrast brain MRI is recommended for further evaluation. 3. Mild chronic small vessel ischemic disease. 4. Cervical disc degeneration greatest at C6-7 where there is moderate spinal stenosis with slight cord flattening. 5. Moderate spinal stenosis at C5-6 and mild spinal stenosis at C3-4 and C4-5. 6. Moderate to severe left neural foraminal stenosis at C3-4. Electronically Signed   By: Logan Bores M.D.   On: 12/27/2017 19:03    Procedures Procedures (including critical care time)  Medications Ordered in ED Medications  dexamethasone (DECADRON) injection 10 mg (has no administration in time range)  acetaminophen (TYLENOL) tablet 500 mg (has no administration in time range)  aspirin EC tablet 81 mg (has no administration in time range)  atenolol (TENORMIN) tablet 50 mg (has no administration in time range)  cholestyramine light (PREVALITE) packet 4 g (has no administration in time range)  diclofenac sodium (VOLTAREN) 1 % transdermal gel 1 application (has no administration in time range)  diltiazem (DILACOR XR) 24 hr capsule 120 mg (has no administration in time range)  diphenoxylate-atropine (LOMOTIL) 2.5-0.025 MG per tablet 1 tablet (has no administration in time range)  metFORMIN (GLUCOPHAGE-XR) 24 hr tablet 500 mg (has no administration in time range)  famotidine-calcium carbonate-magnesium hydroxide (PEPCID COMPLETE) 10-800-165 MG chewable tablet 1 tablet (has no administration in time range)  rOPINIRole (REQUIP) tablet 1 mg (has no administration in time range)  pravastatin (PRAVACHOL) tablet 20 mg (has no administration in time range)  triamterene-hydrochlorothiazide (MAXZIDE) 75-50 MG per tablet 1  tablet (has no administration in time range)  acetaminophen (TYLENOL) tablet 650 mg (650 mg Oral Given 12/27/17 1519)  morphine 4 MG/ML injection 4 mg (4 mg Intravenous Given 12/27/17 1636)  morphine 4 MG/ML injection 4 mg (4 mg Intravenous Given 12/27/17 1927)  dexamethasone (DECADRON) injection 10 mg (10 mg Intravenous Given 12/27/17 2101)     Initial Impression / Assessment and Plan / ED Course  I have reviewed the triage vital signs and the nursing notes.  Pertinent labs & imaging results that were available during my care of the patient were reviewed by me and considered in my medical decision making (see chart for details).     MDM  Screen complete  Patient is presenting for evaluation of right arm weakness.  No evidence found on work-up of CVA.  MRI C-spine was suspicious for possible compression.   Dr. Saintclair Halsted (Neurosurgery) has evaluated patient. He does not feel that patient requires acute surgical intervention.   Hospitalist service is aware of case and will evaluate for admission.   Final Clinical Impressions(s) / ED Diagnoses   Final diagnoses:  Right arm weakness    ED Discharge Orders    None       Valarie Merino,  MD 12/27/17 2200    Valarie Merino, MD 01/04/18 1530

## 2017-12-27 NOTE — ED Triage Notes (Signed)
Pt from home with ems for right hand weakness and headache and dizziness that started at 7am this morning. Pt was seen a couple days ago for right shoulder pain as well. Pt had a fall due to dizziness that occurred this morning, no LOC or reports of hitting his head. Pt alert and oriented upon arrival to ED. Ambulatory from ems stretcher but dizzy during ambulation.   Bp 136/76 HR50 98% on room air CBG [id number] #[id number]

## 2017-12-27 NOTE — ED Notes (Signed)
ED Provider at bedside. 

## 2017-12-27 NOTE — ED Notes (Signed)
Dr. Gardner at bedside 

## 2017-12-27 NOTE — Consult Note (Signed)
Reason for Consult: Right hand weakness neck pain Referring Physician: Emergency department  Gregory Cook is an 67 y.o. male.  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  History of hypertension history of stroke.  Presented with several days of neck pain interscapular pain and pain radiating primarily to his shoulder and down his right arm into the first 3 fingers of his right hand.  And the pain that radiates is not so much of sharp shooting pain is more of a constant burning dysesthetic pain.  And then 7:00 this morning he started experiencing significant weakness of his right hand but not weakness of the right upper extremity.  Denies any significant weakness in the left upper extremity.  He does report chronic numbness in the last 2 fingers of both hands related to his previous stroke.  Patient was seen in urgent care yesterday morning was placed on muscle relaxers was given a shot of some unknown medication.  Past Medical History:  Diagnosis Date  . Hypertension   . Liver metastasis ([place])   . Stroke [facility])     Past Surgical History:  Procedure Laterality Date  . IR GENERIC HISTORICAL  05/07/2016   IR RADIOLOGIST EVAL & MGMT 05/07/2016 Gregory Cadet, MD [facility] INTERV RAD  . IR RADIOLOGIST EVAL & MGMT  10/29/2017    No family history on file.  Social History:  reports that he has never smoked. He has never used smokeless tobacco. He reports that he does not drink alcohol or use drugs.  Allergies:  Allergies  Allergen Reactions  . Nuvigil [Armodafinil] Swelling    Lip and throat swelling    Medications: I have reviewed the patient's current medications.  Results for orders placed or performed during the hospital encounter of 12/27/17 (from the past 48 hour(s))  CBG monitoring, ED     Status: Abnormal   Collection Time: 12/27/17  3:02 PM  Result Value Ref  Range   Glucose-Capillary 168 (H) 70 - 99 mg/dL   Comment 1 Notify RN    Comment 2 Document in Chart   Protime-INR     Status: None   Collection Time: 12/27/17  3:10 PM  Result Value Ref Range   Prothrombin Time 13.1 11.4 - 15.2 seconds   INR 1.00     Comment: Performed at [facility] [address] [place], [place] [address]  APTT     Status: None   Collection Time: 12/27/17  3:10 PM  Result Value Ref Range   aPTT 28 24 - 36 seconds    Comment: Performed at [facility] [address] [place], [place] [address]  CBC     Status: Abnormal   Collection Time: 12/27/17  3:10 PM  Result Value Ref Range   WBC 8.3 4.0 - 10.5 K/uL   RBC 5.57 4.22 - 5.81 MIL/uL   Hemoglobin 17.5 (H) 13.0 - 17.0 g/dL   HCT 51.5 39.0 - 52.0 %   MCV 92.5 78.0 - 100.0 fL   MCH 31.4 26.0 - 34.0 pg   MCHC 34.0 30.0 - 36.0 g/dL   RDW 14.6 11.5 - 15.5 %   Platelets 96 (L) 150 - 400 K/uL    Comment: SPECIMEN CHECKED FOR CLOTS REPEATED TO VERIFY PLATELET COUNT CONFIRMED BY SMEAR Performed at [facility], [place] [address], [place], [place] [address]   Differential  Status: None   Collection Time: 12/27/17  3:10 PM  Result Value Ref Range   Neutrophils Relative % 76 %   Neutro Abs 6.4 1.7 - 7.7 K/uL   Lymphocytes Relative 14 %   Lymphs Abs 1.2 0.7 - 4.0 K/uL   Monocytes Relative 6 %   Monocytes Absolute 0.5 0.1 - 1.0 K/uL   Eosinophils Relative 2 %   Eosinophils Absolute 0.1 0.0 - 0.7 K/uL   Basophils Relative 1 %   Basophils Absolute 0.0 0.0 - 0.1 K/uL   Immature Granulocytes 1 %   Abs Immature Granulocytes 0.1 0.0 - 0.1 K/uL    Comment: Performed at [facility] [address] [place], [place] [address]  Comprehensive metabolic panel     Status: Abnormal   Collection Time: 12/27/17  3:10 PM  Result Value Ref Range   Sodium 133 (L) 135 - 145 mmol/L   Potassium 3.6 3.5 - 5.1 mmol/L   Chloride 98 98 - 111 mmol/L   CO2 25 22 - 32 mmol/L   Glucose, Bld 155 (H) 70 - 99  mg/dL   BUN 14 8 - 23 mg/dL   Creatinine, Ser 1.17 0.61 - 1.24 mg/dL   Calcium 9.4 8.9 - 10.3 mg/dL   Total Protein 6.9 6.5 - 8.1 g/dL   Albumin 4.0 3.5 - 5.0 g/dL   AST 32 15 - 41 U/L   ALT 31 0 - 44 U/L   Alkaline Phosphatase 86 38 - 126 U/L   Total Bilirubin 1.0 0.3 - 1.2 mg/dL   GFR calc non Af Amer >60 >60 mL/min   GFR calc Af Amer >60 >60 mL/min    Comment: (NOTE) The eGFR has been calculated using the CKD EPI equation. This calculation has not been validated in all clinical situations. eGFR's persistently <60 mL/min signify possible Chronic Kidney Disease.    Anion gap 10 5 - 15    Comment: Performed at [facility] [address], [place], [place] [address]  I-stat troponin, ED     Status: None   Collection Time: 12/27/17  3:23 PM  Result Value Ref Range   Troponin i, poc 0.00 0.00 - 0.08 ng/mL   Comment 3            Comment: Due to the release kinetics of cTnI, a negative result within the first hours of the onset of symptoms does not rule out myocardial infarction with certainty. If myocardial infarction is still suspected, repeat the test at appropriate intervals.     Ct Head Wo Contrast  Result Date: 12/27/2017 CLINICAL DATA:  Status post fall. EXAM: CT HEAD WITHOUT CONTRAST TECHNIQUE: Contiguous axial images were obtained from the base of the skull through the vertex without intravenous contrast. COMPARISON:  None. FINDINGS: Brain: 7 mm hyperattenuating lesion is identified in the inferior left frontal lobe, well seen on coronal image 22 of series 5. No obvious surrounding edema. No evidence for hydrocephalus, acute infarction, or midline shift. Vascular: No hyperdense vessel or unexpected calcification. Skull: No evidence for fracture. No worrisome lytic or sclerotic lesion. Sinuses/Orbits: The visualized paranasal sinuses and mastoid air cells are clear. Visualized portions of the globes and intraorbital fat are unremarkable. Other: None. IMPRESSION: 1. 7 mm  subtle hyperattenuating lesion in the inferior left frontal lobe. This is not well characterized by CT but may be a meningioma or vascular malformation. Small hemorrhagic focus also possibility but considered less likely. MRI of the brain recommended to further  evaluate. 2. Otherwise no acute finding. Electronically Signed   By: Misty Stanley M.D.   On: 12/27/2017 15:43   Mr Brain Wo Contrast (neuro Protocol)  Result Date: 12/27/2017 CLINICAL DATA:  Right hand weakness, headache, and dizziness. Fall. Neck and right shoulder pain. EXAM: MRI HEAD WITHOUT CONTRAST MRI CERVICAL SPINE WITHOUT CONTRAST TECHNIQUE: Multiplanar, multiecho pulse sequences of the brain and surrounding structures, and cervical spine, to include the craniocervical junction and cervicothoracic junction, were obtained without intravenous contrast. COMPARISON:  Head CT 12/27/2017 FINDINGS: MRI HEAD FINDINGS Brain: There is no evidence of acute infarct, intracranial hemorrhage, midline shift, or extra-axial fluid collection. There appears to be a round 7 mm T1 hypointense extra-axial nodule inferior to the left frontal lobe in the anterior cranial fossa likely corresponding to the subtle density on CT without significant associated mass effect or brain edema. No definite hemorrhage is evident in this location. The ventricles and sulci are within normal limits for age. Scattered small foci of T2 hyperintensity in the cerebral white matter and pons are nonspecific but compatible with mild chronic small vessel ischemic disease. Vascular: Major intracranial vascular flow voids are preserved. Skull and upper cervical spine: Unremarkable bone marrow signal. Sinuses/Orbits: Unremarkable orbits. Clear paranasal sinuses. No significant mastoid fluid. Other: None. MRI CERVICAL SPINE FINDINGS Alignment: Cervical spine straightening.  No listhesis. Vertebrae: No fracture, suspicious osseous lesion, or significant marrow edema. Mild chronic degenerative  endplate changes at N6-2 associated with moderate to severe disc space narrowing. Moderate disc space narrowing at C7-T1 and mild narrowing at C3-4 and C4-5. Mild diffuse spinal canal narrowing on a congenital basis. Cord: Normal signal. Posterior Fossa, vertebral arteries, paraspinal tissues: Unremarkable. Disc levels: C2-3: Mild left uncovertebral spurring without significant stenosis. C3-4: Disc bulging, left greater than right uncovertebral spurring, and moderate left facet arthrosis result in mild spinal stenosis and moderate to severe left neural foraminal stenosis. C4-5: Broad right paracentral disc protrusion and uncovertebral spurring result in mild spinal stenosis and mild bilateral neural foraminal stenosis. C5-6: Disc bulging results in moderate spinal stenosis without neural foraminal stenosis. C6-7: A broad central disc protrusion results in moderate spinal stenosis with slight cord flattening. Uncovertebral spurring results in borderline bilateral neural foraminal stenosis. C7-T1: Right greater than left uncovertebral spurring without significant stenosis. T1-2: Only imaged sagittally. Disc bulging/bilateral foraminal disc protrusions result in mild-to-moderate bilateral neural foraminal stenosis without spinal stenosis. IMPRESSION: 1. No acute intracranial abnormality. 2. 7 mm extra-axial nodule along the floor of the left anterior cranial fossa suspicious for a small meningioma. Nonemergent postcontrast brain MRI is recommended for further evaluation. 3. Mild chronic small vessel ischemic disease. 4. Cervical disc degeneration greatest at C6-7 where there is moderate spinal stenosis with slight cord flattening. 5. Moderate spinal stenosis at C5-6 and mild spinal stenosis at C3-4 and C4-5. 6. Moderate to severe left neural foraminal stenosis at C3-4. Electronically Signed   By: Logan Bores M.D.   On: 12/27/2017 19:03   Mr Cervical Spine Wo Contrast  Result Date: 12/27/2017 CLINICAL DATA:  Right  hand weakness, headache, and dizziness. Fall. Neck and right shoulder pain. EXAM: MRI HEAD WITHOUT CONTRAST MRI CERVICAL SPINE WITHOUT CONTRAST TECHNIQUE: Multiplanar, multiecho pulse sequences of the brain and surrounding structures, and cervical spine, to include the craniocervical junction and cervicothoracic junction, were obtained without intravenous contrast. COMPARISON:  Head CT 12/27/2017 FINDINGS: MRI HEAD FINDINGS Brain: There is no evidence of acute infarct, intracranial hemorrhage, midline shift, or extra-axial fluid collection. There appears to be a  round 7 mm T1 hypointense extra-axial nodule inferior to the left frontal lobe in the anterior cranial fossa likely corresponding to the subtle density on CT without significant associated mass effect or brain edema. No definite hemorrhage is evident in this location. The ventricles and sulci are within normal limits for age. Scattered small foci of T2 hyperintensity in the cerebral white matter and pons are nonspecific but compatible with mild chronic small vessel ischemic disease. Vascular: Major intracranial vascular flow voids are preserved. Skull and upper cervical spine: Unremarkable bone marrow signal. Sinuses/Orbits: Unremarkable orbits. Clear paranasal sinuses. No significant mastoid fluid. Other: None. MRI CERVICAL SPINE FINDINGS Alignment: Cervical spine straightening.  No listhesis. Vertebrae: No fracture, suspicious osseous lesion, or significant marrow edema. Mild chronic degenerative endplate changes at S8-5 associated with moderate to severe disc space narrowing. Moderate disc space narrowing at C7-T1 and mild narrowing at C3-4 and C4-5. Mild diffuse spinal canal narrowing on a congenital basis. Cord: Normal signal. Posterior Fossa, vertebral arteries, paraspinal tissues: Unremarkable. Disc levels: C2-3: Mild left uncovertebral spurring without significant stenosis. C3-4: Disc bulging, left greater than right uncovertebral spurring, and  moderate left facet arthrosis result in mild spinal stenosis and moderate to severe left neural foraminal stenosis. C4-5: Broad right paracentral disc protrusion and uncovertebral spurring result in mild spinal stenosis and mild bilateral neural foraminal stenosis. C5-6: Disc bulging results in moderate spinal stenosis without neural foraminal stenosis. C6-7: A broad central disc protrusion results in moderate spinal stenosis with slight cord flattening. Uncovertebral spurring results in borderline bilateral neural foraminal stenosis. C7-T1: Right greater than left uncovertebral spurring without significant stenosis. T1-2: Only imaged sagittally. Disc bulging/bilateral foraminal disc protrusions result in mild-to-moderate bilateral neural foraminal stenosis without spinal stenosis. IMPRESSION: 1. No acute intracranial abnormality. 2. 7 mm extra-axial nodule along the floor of the left anterior cranial fossa suspicious for a small meningioma. Nonemergent postcontrast brain MRI is recommended for further evaluation. 3. Mild chronic small vessel ischemic disease. 4. Cervical disc degeneration greatest at C6-7 where there is moderate spinal stenosis with slight cord flattening. 5. Moderate spinal stenosis at C5-6 and mild spinal stenosis at C3-4 and C4-5. 6. Moderate to severe left neural foraminal stenosis at C3-4. Electronically Signed   By: Logan Bores M.D.   On: 12/27/2017 19:03    Review of Systems  Musculoskeletal: Positive for neck pain.  Neurological: Positive for dizziness, focal weakness and headaches.   Blood pressure 113/64, pulse (!) 59, temperature 97.8 F (36.6 C), resp. rate 19, height '5\' 10"'$  (1.778 m), weight 105.7 kg, SpO2 97 %. Physical Exam  Constitutional: He is oriented to person, place, and time. He appears lethargic.  Neurological: He is oriented to person, place, and time. He appears lethargic. GCS eye subscore is 4. GCS verbal subscore is 5. GCS motor subscore is 6.  Patient is  awake and alert pupils equal cranial nerves are intact strength is 5 out of 5 in his left upper extremity deltoid, bicep, tricep, wrist flexion, wrist extension, hand intrinsics.  Right upper extremity has 5 out of 5 strength in deltoid, bicep, tricep, wrist flexion, wrist extension, however he is got hand intrinsic weakness at about 4- out of 5 with inability to make a fist interossei weakness at about 2 out of 5 and a tendency to a claw deformity of his right hand.    Assessment/Plan: 67 year old gentleman with right hand weakness unclear etiology.  MRI scan shows multilevel chronic degenerative disease with mild to moderate disc bulges moderate at  C6-7 but no significant foraminal stenosis.  I do not think that this represents a radiculopathy.  I do not think that this is related to his cervical spine.  Most of the distribution of the C7 nerve root including triceps and wrist flexion are all intact and all primarily hand intrinsic weakness which would not be a classic presentation of a C7 radiculopathy.  He has no significant pathology of C8.  I think it is possible this could be an acute ulnar neuropathy but also I think could be an early manifestation of shingles.  With his history of carcinoid and history of lung cancer certainly could be brachial plexus involvement although all of his weakness again seems to involve the right hand and not proximally in the arm.  I think it is worthwhile to have the patient evaluated by medicine and worked up with potentially a CT scan of his chest to stage his carcinoid and rule out any mets extending into his lung that could be affecting his brachial plexus.  I also think IV steroids of 24 hours 10 mg of Decadron IV every 6 consideration be given to start the patient empirically on acyclovir to treat for shingles.  I extensively went over the disc with the patient and his family and they understand and we will ask the hospitalist service to admit and eval.  Gregory Cook  P 12/27/2017, 8:17 PM

## 2017-12-27 NOTE — ED Notes (Signed)
Patient transported to MRI 

## 2017-12-28 DIAGNOSIS — Z7982 Long term (current) use of aspirin: Secondary | ICD-10-CM | POA: Diagnosis not present

## 2017-12-28 DIAGNOSIS — M4802 Spinal stenosis, cervical region: Secondary | ICD-10-CM | POA: Diagnosis present

## 2017-12-28 DIAGNOSIS — Z7984 Long term (current) use of oral hypoglycemic drugs: Secondary | ICD-10-CM | POA: Diagnosis not present

## 2017-12-28 DIAGNOSIS — C7800 Secondary malignant neoplasm of unspecified lung: Secondary | ICD-10-CM | POA: Diagnosis present

## 2017-12-28 DIAGNOSIS — R29898 Other symptoms and signs involving the musculoskeletal system: Secondary | ICD-10-CM | POA: Diagnosis present

## 2017-12-28 DIAGNOSIS — E1165 Type 2 diabetes mellitus with hyperglycemia: Secondary | ICD-10-CM | POA: Diagnosis present

## 2017-12-28 DIAGNOSIS — N400 Enlarged prostate without lower urinary tract symptoms: Secondary | ICD-10-CM | POA: Diagnosis present

## 2017-12-28 DIAGNOSIS — M25511 Pain in right shoulder: Secondary | ICD-10-CM | POA: Diagnosis present

## 2017-12-28 DIAGNOSIS — G4733 Obstructive sleep apnea (adult) (pediatric): Secondary | ICD-10-CM | POA: Diagnosis present

## 2017-12-28 DIAGNOSIS — Z888 Allergy status to other drugs, medicaments and biological substances status: Secondary | ICD-10-CM | POA: Diagnosis not present

## 2017-12-28 DIAGNOSIS — D696 Thrombocytopenia, unspecified: Secondary | ICD-10-CM | POA: Diagnosis present

## 2017-12-28 DIAGNOSIS — C7B04 Secondary carcinoid tumors of peritoneum: Secondary | ICD-10-CM | POA: Diagnosis present

## 2017-12-28 DIAGNOSIS — R531 Weakness: Secondary | ICD-10-CM | POA: Diagnosis present

## 2017-12-28 DIAGNOSIS — Z8673 Personal history of transient ischemic attack (TIA), and cerebral infarction without residual deficits: Secondary | ICD-10-CM | POA: Diagnosis not present

## 2017-12-28 DIAGNOSIS — C7B02 Secondary carcinoid tumors of liver: Secondary | ICD-10-CM | POA: Diagnosis present

## 2017-12-28 DIAGNOSIS — I10 Essential (primary) hypertension: Secondary | ICD-10-CM | POA: Diagnosis present

## 2017-12-28 DIAGNOSIS — M542 Cervicalgia: Secondary | ICD-10-CM | POA: Diagnosis present

## 2017-12-28 DIAGNOSIS — E119 Type 2 diabetes mellitus without complications: Secondary | ICD-10-CM | POA: Diagnosis not present

## 2017-12-28 DIAGNOSIS — M79641 Pain in right hand: Secondary | ICD-10-CM | POA: Diagnosis present

## 2017-12-28 DIAGNOSIS — Z79899 Other long term (current) drug therapy: Secondary | ICD-10-CM | POA: Diagnosis not present

## 2017-12-28 DIAGNOSIS — C7A019 Malignant carcinoid tumor of the small intestine, unspecified portion: Secondary | ICD-10-CM | POA: Diagnosis present

## 2017-12-28 DIAGNOSIS — Z23 Encounter for immunization: Secondary | ICD-10-CM | POA: Diagnosis present

## 2017-12-28 LAB — BASIC METABOLIC PANEL
ANION GAP: 11 (ref 5–15)
BUN: 16 mg/dL (ref 8–23)
CO2: 22 mmol/L (ref 22–32)
Calcium: 9.2 mg/dL (ref 8.9–10.3)
Chloride: 99 mmol/L (ref 98–111)
Creatinine, Ser: 1.16 mg/dL (ref 0.61–1.24)
Glucose, Bld: 231 mg/dL — ABNORMAL HIGH (ref 70–99)
POTASSIUM: 4 mmol/L (ref 3.5–5.1)
SODIUM: 132 mmol/L — AB (ref 135–145)

## 2017-12-28 LAB — GLUCOSE, CAPILLARY
GLUCOSE-CAPILLARY: 225 mg/dL — AB (ref 70–99)
Glucose-Capillary: 242 mg/dL — ABNORMAL HIGH (ref 70–99)
Glucose-Capillary: 235 mg/dL — ABNORMAL HIGH (ref 70–99)
Glucose-Capillary: 225 mg/dL — ABNORMAL HIGH (ref 70–99)

## 2017-12-28 LAB — HIV ANTIBODY (ROUTINE TESTING W REFLEX): HIV SCREEN 4TH GENERATION: NONREACTIVE

## 2017-12-28 MED ORDER — FENTANYL CITRATE (PF) 100 MCG/2ML IJ SOLN
50.0000 ug | INTRAMUSCULAR | Status: DC | PRN
  Administered 2017-12-28: 50 ug via INTRAVENOUS
  Administered 2017-12-28 – 2017-12-30 (×14): 100 ug via INTRAVENOUS
  Filled 2017-12-28 (×15): qty 2

## 2017-12-28 MED ORDER — HYDROMORPHONE HCL 1 MG/ML IJ SOLN
0.5000 mg | INTRAMUSCULAR | Status: DC | PRN
  Administered 2017-12-28: 1 mg via INTRAVENOUS
  Filled 2017-12-28: qty 1

## 2017-12-28 MED ORDER — KETOROLAC TROMETHAMINE 30 MG/ML IJ SOLN: 30.0000 mg | Freq: Once | INTRAMUSCULAR | Status: DC

## 2017-12-28 MED ORDER — GABAPENTIN 300 MG PO CAPS
300.0000 mg | ORAL_CAPSULE | Freq: Once | ORAL | Status: AC
  Administered 2017-12-28: 300 mg via ORAL
  Filled 2017-12-28: qty 1

## 2017-12-28 MED ORDER — GABAPENTIN 300 MG PO CAPS
300.0000 mg | ORAL_CAPSULE | Freq: Three times a day (TID) | ORAL | Status: DC
  Administered 2017-12-30 (×2): 300 mg via ORAL
  Filled 2017-12-28 (×2): qty 1

## 2017-12-28 MED ORDER — INFLUENZA VAC SPLIT QUAD 0.5 ML IM SUSY
0.5000 mL | PREFILLED_SYRINGE | INTRAMUSCULAR | Status: AC
  Administered 2017-12-29: 0.5 mL via INTRAMUSCULAR
  Filled 2017-12-28: qty 0.5

## 2017-12-28 MED ORDER — GABAPENTIN 300 MG PO CAPS
300.0000 mg | ORAL_CAPSULE | Freq: Two times a day (BID) | ORAL | Status: AC
  Administered 2017-12-29 (×2): 300 mg via ORAL
  Filled 2017-12-28 (×2): qty 1

## 2017-12-28 MED ORDER — VALACYCLOVIR HCL 500 MG PO TABS
1000.0000 mg | ORAL_TABLET | Freq: Three times a day (TID) | ORAL | Status: DC
  Administered 2017-12-28 – 2017-12-30 (×6): 1000 mg via ORAL
  Filled 2017-12-28 (×7): qty 2

## 2017-12-28 MED ORDER — DEXTROSE 5 % IV SOLN
5.0000 mg/kg | Freq: Three times a day (TID) | INTRAVENOUS | Status: DC
  Administered 2017-12-28 (×2): 430 mg via INTRAVENOUS
  Filled 2017-12-28 (×3): qty 8.6

## 2017-12-28 NOTE — Progress Notes (Addendum)
Morphine and dilaudid no effect.  Will try adding neurontin for neuropathic pain to see if this helps.  Change dilaudid to fentanyl 50-100 mcg Q2H PRN  Neurontin ordered as 300mg  once on day 1 then 2x on day 2 then 3x on day 3.  Arm re-evaluated at bedside, still has good wrist pulse, cap refill.  No swelling nor skin changes, etc.

## 2017-12-28 NOTE — Progress Notes (Signed)
Pt arrived via hospital bed and walked from bed to bed in room steadily with minimal assistance. Wife at bedside, oriented patient to room and [facility] policies and addressed pain

## 2017-12-28 NOTE — Progress Notes (Addendum)
History and physical examination    Gregory Cook  NKN:[id number] DOB: January 23, 1951 DOA: 12/27/2017 PCP: Patient, No Pcp Per    Brief Narrative:   Gregory Cook is a 67 y.o. 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  Patient presents to the ED with several day history of neck pain radiating down arm into first 3 fingers of R hand.  Pain is burning, constant, dysesthetic.  At 7 am yesterday morning started developing significant weakness of his R hand.  Seen by Dr. Saintclair Halsted.  MRI brain and C spine unrevealing as to cause of R hand pain / weakness.  Patient was started on steroids and antivirals (Dr. Saintclair Halsted had suggested this might be shingles) the patient has not had any significant relief and has now required IV steroids, IV fentanyl, and IV morphine for pain control.  Personally reviewed his films with Dr. Markus Daft from interventional radiology.  He is going to consider whether or not cervical steroid injections may be of benefit to the patient.  This point patient continues to have severe 10 out of 10 pain in his right arm with associated weakness.  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 R hand pain and weakness - 1. MRI brain and C spine neg for source 2. See Dr. Saintclair Halsted consult note 1. Decadron 10mg  Q6H; not much relief IV fentanyl ordered 2. CT chest including the R shoulder and brachial plexus area is being performed right now to eval for brachial plexus lesion 3. ACYCLOVIR BEGUN FOR SHINGLES  2. DM2 - 1. Hold metformin 2. Mod scale SSI AC 3. HTN - continue home BP meds Metastatic carcinoid - continue home meds, CT chest pending as above, findings would not be consistent with current known disease process DVT  prophylaxis: Lovenox Code Status: Full Family Communication: Wife at bedside Disposition Plan: Home after admit Consults called: NS Dr. Saintclair Halsted Admission status: Place in obs   Procedures: Currently being evaluated for possible steroid injections via interventional radiology   Review of Systems  All other systems reviewed and are negative.   Subjective: Patient with some relief of symptoms but only while pain medications on board.  Requiring hefty doses of IV narcotics to relieve his discomfort.  Objective: Vitals:   12/28/17 0339 12/28/17 0526 12/28/17 1025 12/28/17 1327  BP: (!) 150/79 135/77 128/77 129/72  Pulse: 68 70 79 77  Resp: 18 18  19   Temp: 98.2 F (36.8 C) 97.8 F (36.6 C)  98 F (36.7 C)  TempSrc: Oral Oral  Oral  SpO2: 98% 98%  95%  Weight: 102.6 kg     Height: 5\' 11"  (1.803 m)       Intake/Output Summary (Last 24 hours) at 12/28/2017 1517 Last data filed at 12/28/2017 1451 Gross per 24 hour  Intake 867.22 ml  Output 250 ml  Net 617.22 ml   Filed Weights   12/27/17 1503 12/28/17 0339  Weight: 105.7 kg 102.6 kg    Examination:  General exam: Appears calm and comfortable  Respiratory system: Clear to auscultation. Respiratory effort normal. Cardiovascular system: S1 & S2 heard, RRR. No JVD, murmurs, rubs, gallops or clicks. No pedal edema. Gastrointestinal system: Abdomen is nondistended, soft and nontender. No organomegaly or masses  felt. Normal bowel sounds heard. Central nervous system: Alert and oriented.  Pain and tingling down right arm Extremities: Symmetric decreased strength in right hand Skin: No rashes, lesions or ulcers Psychiatry: Judgement and insight appear normal. Mood & affect appropriate.     Data Reviewed: I have personally reviewed following labs and imaging studies  CBC: Recent Labs  Lab 12/27/17 1510  WBC 8.3  NEUTROABS 6.4  HGB 17.5*  HCT 51.5  MCV 92.5  PLT 96*   Basic Metabolic Panel: Recent Labs  Lab  12/27/17 1510 12/28/17 0323  NA 133* 132*  K 3.6 4.0  CL 98 99  CO2 25 22  GLUCOSE 155* 231*  BUN 14 16  CREATININE 1.17 1.16  CALCIUM 9.4 9.2   GFR: Estimated Creatinine Clearance: 73.3 mL/min (by C-G formula based on SCr of 1.16 mg/dL). Liver Function Tests: Recent Labs  Lab 12/27/17 1510  AST 32  ALT 31  ALKPHOS 86  BILITOT 1.0  PROT 6.9  ALBUMIN 4.0   No results for input(s): LIPASE, AMYLASE in the last 168 hours. No results for input(s): AMMONIA in the last 168 hours. Coagulation Profile: Recent Labs  Lab 12/27/17 1510  INR 1.00   Cardiac Enzymes: No results for input(s): CKTOTAL, CKMB, CKMBINDEX, TROPONINI in the last 168 hours. BNP (last 3 results) No results for input(s): PROBNP in the last 8760 hours. HbA1C: No results for input(s): HGBA1C in the last 72 hours. CBG: Recent Labs  Lab 12/27/17 1502 12/28/17 0926 12/28/17 1234  GLUCAP 168* 242* 235*   Lipid Profile: No results for input(s): CHOL, HDL, LDLCALC, TRIG, CHOLHDL, LDLDIRECT in the last 72 hours. Thyroid Function Tests: No results for input(s): TSH, T4TOTAL, FREET4, T3FREE, THYROIDAB in the last 72 hours. Anemia Panel: No results for input(s): VITAMINB12, FOLATE, FERRITIN, TIBC, IRON, RETICCTPCT in the last 72 hours. Sepsis Labs: No results for input(s): PROCALCITON, LATICACIDVEN in the last 168 hours.  No results found for this or any previous visit (from the past 240 hour(s)).       Radiology Studies: Ct Head Wo Contrast  Result Date: 12/27/2017 CLINICAL DATA:  Status post fall. EXAM: CT HEAD WITHOUT CONTRAST TECHNIQUE: Contiguous axial images were obtained from the base of the skull through the vertex without intravenous contrast. COMPARISON:  None. FINDINGS: Brain: 7 mm hyperattenuating lesion is identified in the inferior left frontal lobe, well seen on coronal image 22 of series 5. No obvious surrounding edema. No evidence for hydrocephalus, acute infarction, or midline shift.  Vascular: No hyperdense vessel or unexpected calcification. Skull: No evidence for fracture. No worrisome lytic or sclerotic lesion. Sinuses/Orbits: The visualized paranasal sinuses and mastoid air cells are clear. Visualized portions of the globes and intraorbital fat are unremarkable. Other: None. IMPRESSION: 1. 7 mm subtle hyperattenuating lesion in the inferior left frontal lobe. This is not well characterized by CT but may be a meningioma or vascular malformation. Small hemorrhagic focus also possibility but considered less likely. MRI of the brain recommended to further evaluate. 2. Otherwise no acute finding. Electronically Signed   By: Misty Stanley M.D.   On: 12/27/2017 15:43   Ct Chest W Contrast  Result Date: 12/27/2017 CLINICAL DATA:  Right hand weakness, headache and dizziness. Carcinoid metastatic to lungs. Suspicious for right arm brachial plexus lesion. EXAM: CT CHEST WITH CONTRAST TECHNIQUE: Multidetector CT imaging of the chest was performed during intravenous contrast administration. CONTRAST:  19mL OMNIPAQUE IOHEXOL 300 MG/ML  SOLN COMPARISON:  None. FINDINGS: Cardiovascular: Heart size is  normal. No pericardial effusion is seen. There is coronary arteriosclerosis involving the left main, RCA and LAD. Nonaneurysmal atherosclerotic aorta. No large central pulmonary embolus though the study is not tailored toward assessment of the pulmonary vasculature. Mediastinum/Nodes: No enlarged mediastinal, hilar, or axillary lymph nodes. Thyroid gland, trachea, and esophagus demonstrate no significant findings. Lungs/Pleura: Tiny 2 mm subpleural nodular density in the right upper lobe, series 5/69. No dominant mass. Dependent bibasilar atelectasis is noted. No pleural effusion or pneumothorax. No pulmonary consolidation or edema. Upper Abdomen: Hepatic steatosis.  Cholecystectomy. Musculoskeletal: No muscle atrophy to suggest a brachial plexopathy though MRI would be more sensitive in the assessment of  brachial plexus abnormalities. IMPRESSION: 1. Coronary arteriosclerosis and aortic atherosclerosis. 2. No dominant pulmonary mass. Tiny 2 mm nodular density in the right upper lobe. No follow-up needed if patient is low-risk. Non-contrast chest CT can be considered in 12 months if patient is high-risk. This recommendation follows the consensus statement: Guidelines for Management of Incidental Pulmonary Nodules Detected on CT Images: From the Fleischner Society 2017; Radiology 2017; 284:228-243. 3. Hepatic steatosis. Aortic Atherosclerosis (ICD10-I70.0). Electronically Signed   By: Ashley Royalty M.D.   On: 12/27/2017 22:32   Mr Brain Wo Contrast (neuro Protocol)  Result Date: 12/27/2017 CLINICAL DATA:  Right hand weakness, headache, and dizziness. Fall. Neck and right shoulder pain. EXAM: MRI HEAD WITHOUT CONTRAST MRI CERVICAL SPINE WITHOUT CONTRAST TECHNIQUE: Multiplanar, multiecho pulse sequences of the brain and surrounding structures, and cervical spine, to include the craniocervical junction and cervicothoracic junction, were obtained without intravenous contrast. COMPARISON:  Head CT 12/27/2017 FINDINGS: MRI HEAD FINDINGS Brain: There is no evidence of acute infarct, intracranial hemorrhage, midline shift, or extra-axial fluid collection. There appears to be a round 7 mm T1 hypointense extra-axial nodule inferior to the left frontal lobe in the anterior cranial fossa likely corresponding to the subtle density on CT without significant associated mass effect or brain edema. No definite hemorrhage is evident in this location. The ventricles and sulci are within normal limits for age. Scattered small foci of T2 hyperintensity in the cerebral white matter and pons are nonspecific but compatible with mild chronic small vessel ischemic disease. Vascular: Major intracranial vascular flow voids are preserved. Skull and upper cervical spine: Unremarkable bone marrow signal. Sinuses/Orbits: Unremarkable orbits. Clear  paranasal sinuses. No significant mastoid fluid. Other: None. MRI CERVICAL SPINE FINDINGS Alignment: Cervical spine straightening.  No listhesis. Vertebrae: No fracture, suspicious osseous lesion, or significant marrow edema. Mild chronic degenerative endplate changes at B7-1 associated with moderate to severe disc space narrowing. Moderate disc space narrowing at C7-T1 and mild narrowing at C3-4 and C4-5. Mild diffuse spinal canal narrowing on a congenital basis. Cord: Normal signal. Posterior Fossa, vertebral arteries, paraspinal tissues: Unremarkable. Disc levels: C2-3: Mild left uncovertebral spurring without significant stenosis. C3-4: Disc bulging, left greater than right uncovertebral spurring, and moderate left facet arthrosis result in mild spinal stenosis and moderate to severe left neural foraminal stenosis. C4-5: Broad right paracentral disc protrusion and uncovertebral spurring result in mild spinal stenosis and mild bilateral neural foraminal stenosis. C5-6: Disc bulging results in moderate spinal stenosis without neural foraminal stenosis. C6-7: A broad central disc protrusion results in moderate spinal stenosis with slight cord flattening. Uncovertebral spurring results in borderline bilateral neural foraminal stenosis. C7-T1: Right greater than left uncovertebral spurring without significant stenosis. T1-2: Only imaged sagittally. Disc bulging/bilateral foraminal disc protrusions result in mild-to-moderate bilateral neural foraminal stenosis without spinal stenosis. IMPRESSION: 1. No acute intracranial abnormality. 2.  7 mm extra-axial nodule along the floor of the left anterior cranial fossa suspicious for a small meningioma. Nonemergent postcontrast brain MRI is recommended for further evaluation. 3. Mild chronic small vessel ischemic disease. 4. Cervical disc degeneration greatest at C6-7 where there is moderate spinal stenosis with slight cord flattening. 5. Moderate spinal stenosis at C5-6 and  mild spinal stenosis at C3-4 and C4-5. 6. Moderate to severe left neural foraminal stenosis at C3-4. Electronically Signed   By: Logan Bores M.D.   On: 12/27/2017 19:03   Mr Cervical Spine Wo Contrast  Result Date: 12/27/2017 CLINICAL DATA:  Right hand weakness, headache, and dizziness. Fall. Neck and right shoulder pain. EXAM: MRI HEAD WITHOUT CONTRAST MRI CERVICAL SPINE WITHOUT CONTRAST TECHNIQUE: Multiplanar, multiecho pulse sequences of the brain and surrounding structures, and cervical spine, to include the craniocervical junction and cervicothoracic junction, were obtained without intravenous contrast. COMPARISON:  Head CT 12/27/2017 FINDINGS: MRI HEAD FINDINGS Brain: There is no evidence of acute infarct, intracranial hemorrhage, midline shift, or extra-axial fluid collection. There appears to be a round 7 mm T1 hypointense extra-axial nodule inferior to the left frontal lobe in the anterior cranial fossa likely corresponding to the subtle density on CT without significant associated mass effect or brain edema. No definite hemorrhage is evident in this location. The ventricles and sulci are within normal limits for age. Scattered small foci of T2 hyperintensity in the cerebral white matter and pons are nonspecific but compatible with mild chronic small vessel ischemic disease. Vascular: Major intracranial vascular flow voids are preserved. Skull and upper cervical spine: Unremarkable bone marrow signal. Sinuses/Orbits: Unremarkable orbits. Clear paranasal sinuses. No significant mastoid fluid. Other: None. MRI CERVICAL SPINE FINDINGS Alignment: Cervical spine straightening.  No listhesis. Vertebrae: No fracture, suspicious osseous lesion, or significant marrow edema. Mild chronic degenerative endplate changes at X9-0 associated with moderate to severe disc space narrowing. Moderate disc space narrowing at C7-T1 and mild narrowing at C3-4 and C4-5. Mild diffuse spinal canal narrowing on a congenital  basis. Cord: Normal signal. Posterior Fossa, vertebral arteries, paraspinal tissues: Unremarkable. Disc levels: C2-3: Mild left uncovertebral spurring without significant stenosis. C3-4: Disc bulging, left greater than right uncovertebral spurring, and moderate left facet arthrosis result in mild spinal stenosis and moderate to severe left neural foraminal stenosis. C4-5: Broad right paracentral disc protrusion and uncovertebral spurring result in mild spinal stenosis and mild bilateral neural foraminal stenosis. C5-6: Disc bulging results in moderate spinal stenosis without neural foraminal stenosis. C6-7: A broad central disc protrusion results in moderate spinal stenosis with slight cord flattening. Uncovertebral spurring results in borderline bilateral neural foraminal stenosis. C7-T1: Right greater than left uncovertebral spurring without significant stenosis. T1-2: Only imaged sagittally. Disc bulging/bilateral foraminal disc protrusions result in mild-to-moderate bilateral neural foraminal stenosis without spinal stenosis. IMPRESSION: 1. No acute intracranial abnormality. 2. 7 mm extra-axial nodule along the floor of the left anterior cranial fossa suspicious for a small meningioma. Nonemergent postcontrast brain MRI is recommended for further evaluation. 3. Mild chronic small vessel ischemic disease. 4. Cervical disc degeneration greatest at C6-7 where there is moderate spinal stenosis with slight cord flattening. 5. Moderate spinal stenosis at C5-6 and mild spinal stenosis at C3-4 and C4-5. 6. Moderate to severe left neural foraminal stenosis at C3-4. Electronically Signed   By: Logan Bores M.D.   On: 12/27/2017 19:03        Scheduled Meds: . acetaminophen  500 mg Oral BID  . aspirin EC  81 mg Oral QHS  .  atenolol  50 mg Oral Daily  . cholestyramine light  4 g Oral QPC breakfast  . dexamethasone  10 mg Intravenous Q6H  . diltiazem  120 mg Oral QHS  . diphenoxylate-atropine  1 tablet Oral  Daily  . enoxaparin (LOVENOX) injection  40 mg Subcutaneous Q24H  . famotidine  20 mg Oral QHS  . [START ON 12/29/2017] gabapentin  300 mg Oral BID  . [START ON 12/30/2017] gabapentin  300 mg Oral TID  . [START ON 12/29/2017] Influenza vac split quadrivalent PF  0.5 mL Intramuscular Tomorrow-1000  . insulin aspart  0-15 Units Subcutaneous TID WC  . pravastatin  20 mg Oral QHS  . rOPINIRole  1 mg Oral QHS  . triamterene-hydrochlorothiazide  1 tablet Oral Daily  . valACYclovir  1,000 mg Oral TID   Social History   Socioeconomic History  . Marital status: Married    Spouse name: Not on file  . Number of children: Not on file  . Years of education: Not on file  . Highest education level: Not on file  Occupational History  . Not on file  Social Needs  . Financial resource strain: Not on file  . Food insecurity:    Worry: Not on file    Inability: Not on file  . Transportation needs:    Medical: Not on file    Non-medical: Not on file  Tobacco Use  . Smoking status: Never Smoker  . Smokeless tobacco: Never Used  Substance and Sexual Activity  . Alcohol use: No    Alcohol/week: 0.0 standard drinks  . Drug use: No  . Sexual activity: Not on file  Lifestyle  . Physical activity:    Days per week: Not on file    Minutes per session: Not on file  . Stress: Not on file  Relationships  . Social connections:    Talks on phone: Not on file    Gets together: Not on file    Attends religious service: Not on file    Active member of club or organization: Not on file    Attends meetings of clubs or organizations: Not on file    Relationship status: Not on file  . Intimate partner violence:    Fear of current or ex partner: Not on file    Emotionally abused: Not on file    Physically abused: Not on file    Forced sexual activity: Not on file  Other Topics Concern  . Not on file  Social History Narrative  . Not on file    Family History  Problem Relation Age of Onset  . Colon  cancer Neg Hx     LOS: 0 days    Time spent: 78 min    Lady Deutscher, MD, FACP [facility] Pager [phone]  If 7PM-7AM, please contact night-coverage [email or web address] Password [email or web address] 12/28/2017, 3:17 PM

## 2017-12-28 NOTE — Progress Notes (Signed)
1) changed PRN to dilaudid for pain 2) CT neg for brachial plexus lesion 3) will go ahead and try Dr. Windy Carina suggestion of empiric acyclovir

## 2017-12-28 NOTE — Progress Notes (Signed)
Patient ID: Gregory Cook, male   DOB: 15-Feb-1951, 67 y.o.   MRN: [id number] Patient overall feels little better on exam still has significant grip weakness and hand intrinsic weakness  This would be extremely unusual presentation of radiculopathy with no evidence of proximal upper extremity weakness with the degree of hand weakness I do not think that this is a classic C7 radiculopathy.  I am in order a MRI scan of his brachial plexus and right shoulder continue the IV steroids.

## 2017-12-28 NOTE — ED Notes (Signed)
Pt. Did not want morphine due to not relieving pain. Alcario Drought, MD paged.

## 2017-12-28 NOTE — Progress Notes (Signed)
Pharmacy Antibiotic Note  Gregory Cook is a 68 y.o. male admitted on 12/27/2017 with shingles.  Pharmacy has been consulted for acyclovir dosing.  Plan: Acyclovir 5mg /kg ABWt IV q8 hours= 430 mg IV q8 hours F/u renal function and clinical course  Height: 5\' 10"  (177.8 cm) Weight: 233 lb (105.7 kg) IBW/kg (Calculated) : 73  Temp (24hrs), Avg:97.8 F (36.6 C), Min:97.8 F (36.6 C), Max:97.8 F (36.6 C)  Recent Labs  Lab 12/27/17 1510  WBC 8.3  CREATININE 1.17    Estimated Creatinine Clearance: 72.6 mL/min (by C-G formula based on SCr of 1.17 mg/dL).    Allergies  Allergen Reactions  . Nuvigil [Armodafinil] Swelling    Lip and throat swelling    Thank you for allowing pharmacy to be a part of this patient's care.  Excell Seltzer Poteet 12/28/2017 3:24 AM

## 2017-12-29 ENCOUNTER — Inpatient Hospital Stay (HOSPITAL_COMMUNITY): Payer: Medicare Other

## 2017-12-29 DIAGNOSIS — R29898 Other symptoms and signs involving the musculoskeletal system: Secondary | ICD-10-CM

## 2017-12-29 LAB — GLUCOSE, CAPILLARY
GLUCOSE-CAPILLARY: 203 mg/dL — AB (ref 70–99)
GLUCOSE-CAPILLARY: 204 mg/dL — AB (ref 70–99)
GLUCOSE-CAPILLARY: 258 mg/dL — AB (ref 70–99)
Glucose-Capillary: 201 mg/dL — ABNORMAL HIGH (ref 70–99)

## 2017-12-29 MED ORDER — DEXAMETHASONE SODIUM PHOSPHATE 10 MG/ML IJ SOLN
6.0000 mg | Freq: Four times a day (QID) | INTRAMUSCULAR | Status: DC
  Administered 2017-12-29 – 2017-12-30 (×5): 6 mg via INTRAVENOUS
  Filled 2017-12-29 (×5): qty 1

## 2017-12-29 NOTE — Plan of Care (Signed)
  Problem: Education: Goal: Knowledge of General Education information will improve Description Including pain rating scale, medication(s)/side effects and non-pharmacologic comfort measures Outcome: Progressing Note:  POC and pain management reviewed with pt.   

## 2017-12-29 NOTE — Progress Notes (Signed)
Received TC from MRI- unable to complete scan; pt. having too much pain.

## 2017-12-29 NOTE — Consult Note (Addendum)
Neurology Consultation  Reason for Consult: Right hand weakness Referring Physician: Dr. Erlinda Hong  CC: Hand weakness  History is obtained from: Patient  HPI: Gregory Cook is a 67 y.o. male who is right-handed, has had a stroke in the past leaving with tingling in his fingers.  He also has a history of hypertension, diabetes type 2 along with liver metastasis and metastatic carcinoid tumor.  Patient states that on Friday he was throwing bales of hay but had no symptoms other than the next day he felt a little sore on the dorsal aspect of his forearm.  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  Patient states that on Sunday morning when he awoke with his right hand he noted he could not pick up a fork and he could not pick up anything with fine muscle motor aspects.  Since that time he has felt that his hand has been very weak.  He has had an MRI of the brain and also of cervical spine which did not show any foraminal narrowing that would explain a C6, C7, C8 peripheral nerve issue.  The majority of his weakness is from his wrist to his hand.  He has a strong shoulder abduction, bicep flexion, tricep extension, wrist extension, wrist flexion however when you get to his fingers it is extremely weak.  He states he has no pain, tenderness, decreased sensation with this.  When I did the Tinel sign he did state he had a sensation which was common with carpal tunnel.  He states he has no pain on his forearm and has never had this symptom before.  He states that he did not sleep abnormally that night.  He has seen his primary care doctor who gave him tizanidine for his rhomboid pain on that same side however has helped him in any way.      ROS: A 14 point ROS was performed and is negative except as noted in the HPI.   Past Medical History:  Diagnosis Date  . DM2 (diabetes mellitus, type 2) ([place])   . Hypertension   . Liver metastasis ([place])   . Metastatic carcinoid tumor ([place])   .  OSA (obstructive sleep apnea)   . Stroke [facility])      Family History  Problem Relation Age of Onset  . Colon cancer Neg Hx      Social History:   reports that he has never smoked. He has never used smokeless tobacco. He reports that he does not drink alcohol or use drugs.  Medications  Current Facility-Administered Medications:  .  acetaminophen (TYLENOL) tablet 650 mg, 650 mg, Oral, Q6H PRN **OR** acetaminophen (TYLENOL) suppository 650 mg, 650 mg, Rectal, Q6H PRN, Etta Quill, DO .  acetaminophen (TYLENOL) tablet 500 mg, 500 mg, Oral, BID, Alcario Drought, Jared M, DO, 500 mg at 12/29/17 1023 .  aspirin EC tablet 81 mg, 81 mg, Oral, QHS, Etta Quill, DO, 81 mg at 12/28/17 2239 .  atenolol (TENORMIN) tablet 50 mg, 50 mg, Oral, Daily, Jennette Kettle M, DO, 50 mg at 12/29/17 1024 .  cholestyramine light (PREVALITE) packet 4 g, 4 g, Oral, QPC breakfast, Jennette Kettle M, DO, 4 g at 12/29/17 1024 .  dexamethasone (DECADRON) injection 6 mg, 6 mg, Intravenous, Q6H, Kary Kos, MD .  diclofenac sodium (VOLTAREN) 1 % transdermal gel 1 application, 1 application, Topical, TID PRN, Etta Quill, DO .  diltiazem (CARDIZEM CD) 24 hr capsule 120 mg,  120 mg, Oral, QHS, Etta Quill, DO, 120 mg at 12/28/17 2239 .  diphenoxylate-atropine (LOMOTIL) 2.5-0.025 MG per tablet 1 tablet, 1 tablet, Oral, Daily, Jennette Kettle M, DO, 1 tablet at 12/29/17 1026 .  enoxaparin (LOVENOX) injection 40 mg, 40 mg, Subcutaneous, Q24H, Gardner, Jared M, DO, 40 mg at 12/29/17 0751 .  famotidine (PEPCID) tablet 20 mg, 20 mg, Oral, QHS, Gardner, Jared M, DO, 20 mg at 12/28/17 2239 .  fentaNYL (SUBLIMAZE) injection 50-100 mcg, 50-100 mcg, Intravenous, Q2H PRN, Etta Quill, DO, 100 mcg at 12/29/17 1553 .  gabapentin (NEURONTIN) capsule 300 mg, 300 mg, Oral, BID, Jennette Kettle M, DO, 300 mg at 12/29/17 1026 .  [START ON 12/30/2017] gabapentin (NEURONTIN) capsule 300 mg, 300 mg, Oral, TID, Alcario Drought, Jared M, DO .   insulin aspart (novoLOG) injection 0-15 Units, 0-15 Units, Subcutaneous, TID WC, Etta Quill, DO, 5 Units at 12/29/17 1332 .  ondansetron (ZOFRAN) tablet 4 mg, 4 mg, Oral, Q6H PRN **OR** ondansetron (ZOFRAN) injection 4 mg, 4 mg, Intravenous, Q6H PRN, Alcario Drought, Jared M, DO .  pravastatin (PRAVACHOL) tablet 20 mg, 20 mg, Oral, QHS, Gardner, Jared M, DO, 20 mg at 12/28/17 2240 .  rOPINIRole (REQUIP) tablet 1 mg, 1 mg, Oral, QHS, Gardner, Jared M, DO, 1 mg at 12/28/17 2239 .  triamterene-hydrochlorothiazide (MAXZIDE) 75-50 MG per tablet 1 tablet, 1 tablet, Oral, Daily, Etta Quill, DO, 1 tablet at 12/29/17 1024 .  valACYclovir (VALTREX) tablet 1,000 mg, 1,000 mg, Oral, TID, Lady Deutscher, MD, 1,000 mg at 12/29/17 1548   Exam: Current vital signs: BP 116/71 (BP Location: Right Arm)   Pulse 62   Temp 98.1 F (36.7 C) (Oral)   Resp 16   Ht 5' 11"  (1.803 m)   Wt 102.6 kg   SpO2 96%   BMI 31.55 kg/m  Vital signs in last 24 hours: Temp:  [97.8 F (36.6 C)-98.1 F (36.7 C)] 98.1 F (36.7 C) (10/08 1504) Pulse Rate:  [62-66] 62 (10/08 1504) Resp:  [16-20] 16 (10/08 1504) BP: (116-135)/(62-71) 116/71 (10/08 1504) SpO2:  [96 %-98 %] 96 % (10/08 1504)  Physical Exam  Constitutional: Appears well-developed and well-nourished.  Psych: Affect appropriate to situation Eyes: No scleral injection HENT: No OP obstrucion Head: Normocephalic.  Cardiovascular: Normal rate and regular rhythm.  Respiratory: Effort normal, non-labored breathing GI: Soft.  No distension. There is no tenderness.  Skin: WDI  Neuro: Mental Status: Patient is awake, alert, oriented to person, place, month, year, and situation. Patient is able to give a clear and coherent history. No signs of aphasia or neglect Cranial Nerves: II: Visual Fields are full. III,IV, VI: EOMI without ptosis or diploplia.  Pupils are equal, round, and reactive to light.  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  He does have ptosis of his right eye V: Facial sensation is symmetric to temperature VII: Facial movement is symmetric.  VIII: hearing is intact to voice X: Uvula elevates symmetrically XI: Shoulder shrug is symmetric. XII: tongue is midline without atrophy or fasciculations.  Motor: Tone is normal. Bulk is normal. 5/5 strength was present in the left arm left leg and right leg.  He has 5/5 strength in the right shoulder abduction, 5/5 with right arm internal and external rotation, 5/5 strength with bicep flexion and tricep extension, 5/5 strength with wrist extension and flexion.  He has 1/5 with abduction and abduction of fingers, he  has 2/5 strength with flexion of fingers and extension of fingers in addition he has 0/5 of thumb abduction and adduction Sensory: Sensation is symmetric to light touch and temperature in the arms and legs. Deep Tendon Reflexes: 2+ and symmetric in the biceps and patellae.  Plantars: Toes are downgoing bilaterally.  Cerebellar: FNF and HKS are intact bilaterally   Labs I have reviewed labs in epic and the results pertinent to this consultation are:   CBC    Component Value Date/Time   WBC 8.3 12/27/2017 1510   RBC 5.57 12/27/2017 1510   HGB 17.5 (H) 12/27/2017 1510   HCT 51.5 12/27/2017 1510   PLT 96 (L) 12/27/2017 1510   MCV 92.5 12/27/2017 1510   MCH 31.4 12/27/2017 1510   MCHC 34.0 12/27/2017 1510   RDW 14.6 12/27/2017 1510   LYMPHSABS 1.2 12/27/2017 1510   MONOABS 0.5 12/27/2017 1510   EOSABS 0.1 12/27/2017 1510   BASOSABS 0.0 12/27/2017 1510    CMP     Component Value Date/Time   NA 132 (L) 12/28/2017 0323   K 4.0 12/28/2017 0323   CL 99 12/28/2017 0323   CO2 22 12/28/2017 0323   GLUCOSE 231 (H) 12/28/2017 0323   BUN 16 12/28/2017 0323   CREATININE 1.16 12/28/2017 0323   CALCIUM 9.2 12/28/2017 0323   PROT 6.9 12/27/2017 1510   ALBUMIN 4.0 12/27/2017 1510   AST 32 12/27/2017 1510   ALT 31  12/27/2017 1510   ALKPHOS 86 12/27/2017 1510   BILITOT 1.0 12/27/2017 1510   GFRNONAA >60 12/28/2017 0323   GFRAA >60 12/28/2017 0323    Lipid Panel  No results found for: CHOL, TRIG, HDL, CHOLHDL, VLDL, LDLCALC, LDLDIRECT   Imaging I have reviewed the images obtained  CT-scan of the brain: --IMPRESSION: 1. 7 mm subtle hyperattenuating lesion in the inferior left frontal lobe. This is not well characterized by CT but may be a meningioma or vascular malformation. Small hemorrhagic focus also possibility but considered less likely. MRI of the brain recommended to further evaluate. 2. Otherwise no acute finding  MRI examination of the brain: --1. No acute intracranial abnormality. 2. 7 mm extra-axial nodule along the floor of the left anterior cranial fossa suspicious for a small meningioma. Nonemergent postcontrast brain MRI is recommended for further evaluation.  MRI of C-spine:-- 3. Mild chronic small vessel ischemic disease. 4. Cervical disc degeneration greatest at C6-7 where there is moderate spinal stenosis with slight cord flattening. 5. Moderate spinal stenosis at C5-6 and mild spinal stenosis at C3-4 and C4-5. 6. Moderate to severe left neural foraminal stenosis at C3-4.    Etta Quill PA-C [facility] [phone]  M-F  (9:00 am- 5:00 PM)  12/29/2017, 5:23 PM     Assessment:     Impression:  - Right hand weakness - Possible compression of nerves in the wrist distribution  Recommendations: - Nerve conduction studies and EMG with outpatient follow up - TSH, ESR    NEUROHOSPITALIST ADDENDUM Performed a face to face diagnostic evaluation.   I have reviewed the contents of history and physical exam as documented by PA/ARNP/Resident and agree with above documentation.  I have discussed and formulated the above plan as documented. Edits to the note have been made as needed.  Patient has subacute right hand weakness.  On examination  patient has weakness of wrist flexion, interossei, thumb adduction, abduction and opposition. No significant sensory loss in any part of his hand.  This localizes to median and ulnar  nerves, or C6 -T1 nerve roots. I agree with Dr. Saintclair Halsted, it would be less likely for patients C6 stenosis to cause his current symptoms. Nerve conduction study /EMG would be extremely helpful to differentiate peripheral neuropathy from radiculopathy,   identify nerves involved and to identify if there is a compressive lesion in the arm. Unfortunately this cannot be done as inpatient.   I agree with checking AIC, will add on TSH and ESR. \ OT consult Outpatient EMG/NCS and expedited follow up.    Please call back if any questions.   Karena Addison Loni Abdon MD [facility] [phone]   If 7pm to 7am, please call on call as listed on AMION.

## 2017-12-29 NOTE — Progress Notes (Signed)
Patient ID: Gregory Cook, male   DOB: 1950-04-16, 67 y.o.   MRN: [id number] Patient overall has a little more movement in his right hand still very weak unable to make a fist.  Pain is still 7 out of 10 shoulder and arm.  Hand intrinsics 2-3 out of 5 bicep tricep deltoid all 5 out of 5.  Still not convinced that this is a cervical radiculopathy however work-up is negative so far patient is a little better on steroids Neurontin and we have started acyclovir however progress and improvement is slow.  We will try an epidural steroid injection at C6-7 and I feel like we need to consult the neuro hospitalist to evaluate patient and see if they think this could be some other source other than radiculopathy.

## 2017-12-29 NOTE — Progress Notes (Signed)
PROGRESS NOTE  Gregory Cook IDP:[id number] DOB: Oct 30, 1950 DOA: 12/27/2017 PCP: Patient, No Pcp Per   Brief history:  Gregory Cook is a 67 y.o. male with medical history significant of DM2, HTN, metastatic carcinoid with mets to liver and lung (per oncology is stable currently). He reports started to feel right sided (neck, right shoulder, right arm) discomfort two weeks ago,  Symptom progressed the several days ago, pain progress done to right forearm, right hand started to have numbness and tingling, pain wake him up at night, he was evaluated at the ED was given muscle relaxer and sent home. 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)  Right arm pain, right hand weakness: -MRI brain no acute CVA -mri cervical spine with cervical spinal stenosis -mri chest with right brachial plexus. -neurosurgery Dr Gregory Cook consulted, patient  is started on iv decadron, neurontin, analgesics, however, symptom did not improve, Dr Gregory Cook plan to do epidural steroid injection at c6-7.  -Dr Gregory Cook does not think symptom is typical for cervical radiculopathy, he recommended trial dose of acyclovir (h/o shingles, thought no rash on exam) -Dr Gregory Cook also recommended to consult neurohospitalist , case discussed with neurology Dr Gregory Cook who will see patient in consult.   H/o shingles on right side Currently skin unremarkable  Noninsulin dependent  DM2 -home oral meds metformin held  -expect elevated blood glucose on steroids -currently on ssi -will check a1c  HTN:  Stable on home meds  Atenolol, cardizem, maxzide  BPH : on terazosin  Thrombocytopenia: Chronic, no sign of bleeding, monitor He is followed by hematology oncology regularly  metastatic carcinoid with mets to liver and lung stable per recent oncology note from 10/2 On octreotide injection q28days, last injection on 10/2   Code Status: full  Family Communication: patient and wife at bedside  Disposition Plan: not ready to discharge   Consultants:  Neurosurgery Dr Gregory Cook  Neurology Dr Gregory Cook  Procedures:  Plan for epidural steroid injection to c6-7, per Dr Gregory Cook  Antibiotics:  none   Objective: BP 120/62 (BP Location: Right Arm)   Pulse 66   Temp 97.8 F (36.6 C) (Oral)   Resp 20   Ht 5\' 11"  (1.803 m)   Wt 102.6 kg   SpO2 98%   BMI 31.55 kg/m   Intake/Output Summary (Last 24 hours) at 12/29/2017 1220 Last data filed at 12/28/2017 1500 Gross per 24 hour  Intake 960.74 ml  Output -  Net 960.74 ml   Filed Weights   12/27/17 1503 12/28/17 0339  Weight: 105.7 kg 102.6 kg    Exam: Patient is examined daily including today on 12/29/2017, exams remain the same as of yesterday except that has changed    General:  NAD  Cardiovascular: RRR  Respiratory: CTABL  Abdomen: Soft/ND/NT, positive BS  Musculoskeletal: No Edema, right hand weak, not able to make a fist, able to raise right arm above shoulder,  right shoulder bladder point tenderness  Neuro: alert, oriented   Data Reviewed: Basic Metabolic Panel: Recent Labs  Lab 12/27/17 1510 12/28/17 0323  NA 133* 132*  K 3.6 4.0  CL 98 99  CO2 25 22  GLUCOSE 155* 231*  BUN 14 16  CREATININE 1.17 1.16  CALCIUM 9.4 9.2   Liver Function Tests: Recent Labs  Lab 12/27/17 1510  AST 32  ALT 31  ALKPHOS 86  BILITOT 1.0  PROT 6.9  ALBUMIN 4.0   No results for input(s): LIPASE,  AMYLASE in the last 168 hours. No results for input(s): AMMONIA in the last 168 hours. CBC: Recent Labs  Lab 12/27/17 1510  WBC 8.3  NEUTROABS 6.4  HGB 17.5*  HCT 51.5  MCV 92.5  PLT 96*   Cardiac Enzymes:   No results for input(s): CKTOTAL, CKMB, CKMBINDEX, TROPONINI in the last 168 hours. BNP (last 3 results) No results for input(s): BNP in the last 8760 hours.  ProBNP (last 3 results) No results for input(s): PROBNP in the last 8760 hours.  CBG: Recent Labs  Lab 12/28/17 0926 12/28/17 1234 12/28/17 1807 12/28/17 2056 12/29/17 0847  GLUCAP 242* 235* 225* 225* 201*    No results found for this or any previous visit (from the past 240 hour(s)).   Studies: Mr Chest Wo Contrast  Result Date: 12/29/2017 CLINICAL DATA:  Constant neck pain radiating down the right arm into the first 3 fingers of the right hand. Right hand weakness. History of metastatic carcinoid. EXAM: MRI BRACHIAL PLEXUS WITHOUT CONTRAST TECHNIQUE: Multiplanar, multiecho pulse sequences of the neck and surrounding structures were obtained without intravenous contrast. The field of view was focused on the right brachial plexus from the neural foramina to the axilla. COMPARISON:  MRI cervical spine dated December 27, 2017. FINDINGS: Spinal cord Normal caliber and signal of visualized spinal cord. Brachial plexus Roots: Normal. Trunks: Normal. Divisions: Normal. Cords: Normal. Branches: Normal. Muscles and tendons Normal. Bones Cervical spine: Multilevel degenerative changes of the cervical better evaluated on recent cervical spine MRI. C7 transverse processes: Normal. Marrow signal: Normal. 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 IMPRESSION: 1. Normal MRI of the right brachial plexus. Electronically Signed   By: Gregory Cook M.D.   On: 12/29/2017 11:38    Scheduled Meds: . acetaminophen  500 mg Oral BID  . aspirin EC  81 mg Oral QHS  . atenolol  50 mg Oral Daily  .  cholestyramine light  4 g Oral QPC breakfast  . dexamethasone  10 mg Intravenous Q6H  . diltiazem  120 mg Oral QHS  . diphenoxylate-atropine  1 tablet Oral Daily  . enoxaparin (LOVENOX) injection  40 mg Subcutaneous Q24H  . famotidine  20 mg Oral QHS  . gabapentin  300 mg Oral BID  . [START ON 12/30/2017] gabapentin  300 mg Oral TID  . insulin aspart  0-15 Units Subcutaneous TID WC  . pravastatin  20 mg Oral QHS  . rOPINIRole  1 mg Oral QHS  . triamterene-hydrochlorothiazide  1 tablet Oral Daily  . valACYclovir  1,000 mg Oral TID    Continuous Infusions:   Time spent: 35 mins, case discussed with neurology I have personally reviewed and interpreted on  12/29/2017 daily labs, imagings as discussed above under date review session and assessment and plans.  I reviewed all nursing notes, pharmacy notes, consultant notes,  vitals, pertinent old records  I have discussed plan of care as described above with RN ,  patient and family on 12/29/2017   Florencia Reasons MD, PhD  [facility] Pager [phone]. If 7PM-7AM, please contact night-coverage at [email or web address], password [place] 12/29/2017, 12:20 PM  LOS: 1 day

## 2017-12-29 NOTE — Care Management Note (Signed)
Case Management Note  Patient Details  Name: Gregory Cook MRN: [id number] Date of Birth: 05-May-1950  Subjective/Objective:                    Action/Plan:  Will continue to follow for discharge needs. Expected Discharge Date:                  Expected Discharge Plan:  Home/Self Care  In-House Referral:     Discharge planning Services  NA  Post Acute Care Choice:  NA Choice offered to:     DME Arranged:    DME Agency:     HH Arranged:    HH Agency:     Status of Service:  In process, will continue to follow  If discussed at Long Length of Stay Meetings, dates discussed:    Additional Comments:  Marilu Favre, RN 12/29/2017, 11:03 AM

## 2017-12-30 LAB — MAGNESIUM: Magnesium: 2.2 mg/dL (ref 1.7–2.4)

## 2017-12-30 LAB — BASIC METABOLIC PANEL
ANION GAP: 10 (ref 5–15)
BUN: 31 mg/dL — ABNORMAL HIGH (ref 8–23)
CALCIUM: 9 mg/dL (ref 8.9–10.3)
CHLORIDE: 101 mmol/L (ref 98–111)
CO2: 22 mmol/L (ref 22–32)
Creatinine, Ser: 1.31 mg/dL — ABNORMAL HIGH (ref 0.61–1.24)
GFR calc non Af Amer: 54 mL/min — ABNORMAL LOW (ref >60)
GLUCOSE: 202 mg/dL — AB (ref 70–99)
Potassium: 4.4 mmol/L (ref 3.5–5.1)
Sodium: 133 mmol/L — ABNORMAL LOW (ref 135–145)

## 2017-12-30 LAB — CBC WITH DIFFERENTIAL/PLATELET
Abs Immature Granulocytes: 0.24 10*3/uL — ABNORMAL HIGH (ref 0.00–0.07)
BASOS ABS: 0 10*3/uL (ref 0.0–0.1)
Basophils Relative: 0 %
Eosinophils Absolute: 0 10*3/uL (ref 0.0–0.5)
Eosinophils Relative: 0 %
HEMATOCRIT: 49.3 % (ref 39.0–52.0)
HEMOGLOBIN: 16.9 g/dL (ref 13.0–17.0)
Immature Granulocytes: 1 %
LYMPHS ABS: 0.8 10*3/uL (ref 0.7–4.0)
LYMPHS PCT: 4 %
MCH: 31.3 pg (ref 26.0–34.0)
MCHC: 34.3 g/dL (ref 30.0–36.0)
MCV: 91.3 fL (ref 80.0–100.0)
MONOS PCT: 2 %
Monocytes Absolute: 0.4 10*3/uL (ref 0.1–1.0)
Neutro Abs: 17.4 10*3/uL — ABNORMAL HIGH (ref 1.7–7.7)
Neutrophils Relative %: 93 %
Platelets: 127 10*3/uL — ABNORMAL LOW (ref 150–400)
RBC: 5.4 MIL/uL (ref 4.22–5.81)
RDW: 14.6 % (ref 11.5–15.5)
WBC: 18.8 10*3/uL — ABNORMAL HIGH (ref 4.0–10.5)
nRBC: 0 % (ref 0.0–0.2)

## 2017-12-30 LAB — GLUCOSE, CAPILLARY
Glucose-Capillary: 199 mg/dL — ABNORMAL HIGH (ref 70–99)
Glucose-Capillary: 220 mg/dL — ABNORMAL HIGH (ref 70–99)
Glucose-Capillary: 226 mg/dL — ABNORMAL HIGH (ref 70–99)

## 2017-12-30 LAB — HEMOGLOBIN A1C
Hgb A1c MFr Bld: 6 % — ABNORMAL HIGH (ref 4.8–5.6)
MEAN PLASMA GLUCOSE: 125.5 mg/dL

## 2017-12-30 MED ORDER — OXYCODONE HCL 5 MG PO TABS: 5.0000 mg | ORAL_TABLET | Freq: Four times a day (QID) | ORAL | 0 refills | Status: AC | PRN

## 2017-12-30 MED ORDER — METHYLPREDNISOLONE 4 MG PO TBPK: ORAL_TABLET | ORAL | 0 refills | Status: AC

## 2017-12-30 MED ORDER — GABAPENTIN 300 MG PO CAPS: 300.0000 mg | ORAL_CAPSULE | Freq: Three times a day (TID) | ORAL | 0 refills | Status: AC

## 2017-12-30 NOTE — Progress Notes (Deleted)
Both IR and Neurosurgery have been consulted for possible ESI on C6-C7, however due to provider availability no appointment has been scheduled with IR of as yet.  Upon chart review today, appears Dr. Saintclair Halsted has seen the patient and is considering a C6-C7 ESI.  IR will not pursue ESI at this time. Please re-consult if needed.  Brynda Greathouse, MS RD PA-C 12:21 PM

## 2017-12-30 NOTE — Discharge Summary (Signed)
Physician Discharge Summary  Gregory Cook ALP:[id number] DOB: 04/27/1950 DOA: 12/27/2017  PCP: Patient, No Pcp Per  Admit date: 12/27/2017 Discharge date: 12/30/2017  Admitted From: Home Disposition: Home  Recommendations for Outpatient Follow-up:  1. 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  Neurosurgery was consulted.  MRI of the brain was negative for acute CVA and MRI of the C-spine showed cervical spinal stenosis.  MRI of the chest was negative for brachial plexus lesion.  Patient was started on intravenous Decadron and also oral acyclovir for possible shingles although there was no rash.  Neurology has evaluated the patient and recommend outpatient EMG.  Patient will be evaluated by PT/OT and will be discharged with close outpatient follow-up with neurosurgery and neurology.  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  MRI of cervical spine showed cervical spinal stenosis.  MRI of the chest was negative for brachial plexus lesion.   -Patient was empirically started on intravenous Decadron and oral acyclovir.  Acyclovir was started for concern for neuropathic pain secondary to shingles.  Patient never developed any rash and acyclovir does not seem to help.  Will discontinue acyclovir -Dr. Cram/neurosurgery evaluated the patient and he was thinking about  epidural steroid injection at C6-C7 but he will arrange for this as an outpatient.  Spoke to Dr. Saintclair Halsted on phone today and he will arrange for outpatient follow-up.  He recommends discharging on Medrol Dosepak.  Neurology evaluation appreciated, neurology recommends outpatient EMG/nerve conduction studies to rule out median and ulnar nerve problems.  This can be done as an outpatient and also can be arranged at Dr. Windy Carina office.  Outpatient follow-up with neurology and neurosurgery. -Patient still has significant intermittent pain and cannot move much of his fingers in the right hand.  -PT/OT eval  Non-insulin-dependent diabetes mellitus type 2 -Resume metformin discharge  Hypertension -Stable.  Resume home regimen  BPH -Continue terazosin  Thrombocytopenia -Chronic.  Outpatient follow-up with hematology.  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 -Outpatient follow-up with oncology   Discharge Instructions  Discharge Instructions    Ambulatory referral to Neurology   Complete by:  As directed    An appointment is requested in approximately: 1-2 weeks for Right upper extremity weakness   Ambulatory referral to Neurosurgery   Complete by:  As directed    Call MD for:  difficulty breathing, headache or visual disturbances   Complete by:  As directed    Call MD for:  extreme fatigue   Complete by:  As directed    Call MD for:  hives   Complete by:  As directed    Call MD for:  persistant dizziness or light-headedness   Complete by:  As directed    Call MD for:  persistant nausea and vomiting   Complete by:  As directed    Call MD  for:  severe uncontrolled pain   Complete by:  As directed    Call MD for:  temperature >100.4   Complete by:  As directed    Diet - low sodium heart healthy   Complete by:  As directed    Increase activity slowly   Complete by:  As directed      Allergies as of  12/30/2017      Reactions   Nuvigil [armodafinil] Swelling   Lip and throat swelling      Medication List    TAKE these medications   acetaminophen 500 MG tablet Commonly known as:  TYLENOL Take 500 mg by mouth 2 (two) times daily.   aspirin EC 81 MG tablet Take 81 mg by mouth at bedtime.   atenolol 50 MG tablet Commonly known as:  TENORMIN Take 50 mg by mouth daily.   butalbital-acetaminophen-caffeine 50-325-40 MG tablet Commonly known as:  FIORICET, ESGIC Take 1 tablet by mouth every 4 (four) hours as needed for headache.   cholestyramine light 4 GM/DOSE powder Commonly known as:  PREVALITE Take 4 g by mouth daily after breakfast.   diclofenac sodium 1 % Gel Commonly known as:  VOLTAREN Apply 1 application topically 3 (three) times daily as needed (pain).   diltiazem 120 MG 24 hr capsule Commonly known as:  DILACOR XR Take 120 mg by mouth at bedtime.   diphenoxylate-atropine 2.5-0.025 MG tablet Commonly known as:  LOMOTIL Take 1 tablet by mouth daily.   famotidine-calcium carbonate-magnesium hydroxide 10-800-165 MG chewable tablet Commonly known as:  PEPCID COMPLETE Chew 1 tablet by mouth at bedtime.   gabapentin 300 MG capsule Commonly known as:  NEURONTIN Take 1 capsule (300 mg total) by mouth 3 (three) times daily.   metFORMIN 500 MG 24 hr tablet Commonly known as:  GLUCOPHAGE-XR Take 500 mg by mouth daily after supper.   methylPREDNISolone 4 MG Tbpk tablet Commonly known as:  MEDROL DOSEPAK Take by mouth as directed. Take as directed in the dose pack   octreotide 30 MG injection Commonly known as:  SANDOSTATIN LAR Inject 60 mg into the muscle See admin instructions. Inject two doses (60 mg) intramuscularly (one injection in each butt cheek) - once every 28 days - last injection 12/23/17 - administered by [facility])   oxyCODONE 5 MG immediate release tablet Commonly known as:  Oxy IR/ROXICODONE Take 1 tablet (5 mg total) by  mouth every 6 (six) hours as needed for severe pain.   pravastatin 20 MG tablet Commonly known as:  PRAVACHOL Take 20 mg by mouth at bedtime.   rOPINIRole 1 MG tablet Commonly known as:  REQUIP Take 1 mg by mouth at bedtime.   sildenafil 20 MG tablet Commonly known as:  REVATIO Take 40-100 mg by mouth See admin instructions. Take 2-5 tablets (40-100 mg) by mouth 30-60 minutes prior to sexual activity   terazosin 10 MG capsule Commonly known as:  HYTRIN Take 10 mg by mouth at bedtime.   testosterone cypionate 200 MG/ML injection Commonly known as:  DEPOTESTOSTERONE CYPIONATE Inject 80 mg into the muscle every 14 (fourteen) days. For IM use only   tiZANidine 2 MG tablet Commonly known as:  ZANAFLEX Take 2-6 mg by mouth every 8 (eight) hours as needed (shoulder pain).   triamterene-hydrochlorothiazide 75-50 MG tablet Commonly known as:  MAXZIDE Take 1 tablet by mouth daily.      Follow-up Information    PCP. Schedule an appointment as soon as possible  for a visit in 1 week(s).        Kary Kos, MD. Schedule an appointment as soon as possible for a visit in 1 week(s).   Specialty:  Neurosurgery Contact information: [address] [place] [address] [phone]          Allergies  Allergen Reactions  . Nuvigil [Armodafinil] Swelling    Lip and throat swelling    Consultations:  Neurology/neurosurgery   Procedures/Studies: Ct Head Wo Contrast  Result Date: 12/27/2017 CLINICAL DATA:  Status post fall. EXAM: CT HEAD WITHOUT CONTRAST TECHNIQUE: Contiguous axial images were obtained from the base of the skull through the vertex without intravenous contrast. COMPARISON:  None. FINDINGS: Brain: 7 mm hyperattenuating lesion is identified in the inferior left frontal lobe, well seen on coronal image 22 of series 5. No obvious surrounding edema. No evidence for hydrocephalus, acute infarction, or midline shift. Vascular: No hyperdense vessel or  unexpected calcification. Skull: No evidence for fracture. No worrisome lytic or sclerotic lesion. Sinuses/Orbits: The visualized paranasal sinuses and mastoid air cells are clear. Visualized portions of the globes and intraorbital fat are unremarkable. Other: None. IMPRESSION: 1. 7 mm subtle hyperattenuating lesion in the inferior left frontal lobe. This is not well characterized by CT but may be a meningioma or vascular malformation. Small hemorrhagic focus also possibility but considered less likely. MRI of the brain recommended to further evaluate. 2. Otherwise no acute finding. Electronically Signed   By: Misty Stanley M.D.   On: 12/27/2017 15:43   Ct Chest W Contrast  Result Date: 12/27/2017 CLINICAL DATA:  Right hand weakness, headache and dizziness. Carcinoid metastatic to lungs. Suspicious for right arm brachial plexus lesion. EXAM: CT CHEST WITH CONTRAST TECHNIQUE: Multidetector CT imaging of the chest was performed during intravenous contrast administration. CONTRAST:  74mL OMNIPAQUE IOHEXOL 300 MG/ML  SOLN COMPARISON:  None. FINDINGS: Cardiovascular: Heart size is normal. No pericardial effusion is seen. There is coronary arteriosclerosis involving the left main, RCA and LAD. Nonaneurysmal atherosclerotic aorta. No large central pulmonary embolus though the study is not tailored toward assessment of the pulmonary vasculature. Mediastinum/Nodes: No enlarged mediastinal, hilar, or axillary lymph nodes. Thyroid gland, trachea, and esophagus demonstrate no significant findings. Lungs/Pleura: Tiny 2 mm subpleural nodular density in the right upper lobe, series 5/69. No dominant mass. Dependent bibasilar atelectasis is noted. No pleural effusion or pneumothorax. No pulmonary consolidation or edema. Upper Abdomen: Hepatic steatosis.  Cholecystectomy. Musculoskeletal: No muscle atrophy to suggest a brachial plexopathy though MRI would be more sensitive in the assessment of brachial plexus abnormalities.  IMPRESSION: 1. Coronary arteriosclerosis and aortic atherosclerosis. 2. No dominant pulmonary mass. Tiny 2 mm nodular density in the right upper lobe. No follow-up needed if patient is low-risk. Non-contrast chest CT can be considered in 12 months if patient is high-risk. This recommendation follows the consensus statement: Guidelines for Management of Incidental Pulmonary Nodules Detected on CT Images: From the Fleischner Society 2017; Radiology 2017; 284:228-243. 3. Hepatic steatosis. Aortic Atherosclerosis (ICD10-I70.0). Electronically Signed   By: Ashley Royalty M.D.   On: 12/27/2017 22:32   Mr Brain Wo Contrast (neuro Protocol)  Result Date: 12/27/2017 CLINICAL DATA:  Right hand weakness, headache, and dizziness. Fall. Neck and right shoulder pain. EXAM: MRI HEAD WITHOUT CONTRAST MRI CERVICAL SPINE WITHOUT CONTRAST TECHNIQUE: Multiplanar, multiecho pulse sequences of the brain and surrounding structures, and cervical spine, to include the craniocervical junction and cervicothoracic junction, were obtained without intravenous contrast. COMPARISON:  Head CT 12/27/2017 FINDINGS:  MRI HEAD FINDINGS Brain: There is no evidence of acute infarct, intracranial hemorrhage, midline shift, or extra-axial fluid collection. There appears to be a round 7 mm T1 hypointense extra-axial nodule inferior to the left frontal lobe in the anterior cranial fossa likely corresponding to the subtle density on CT without significant associated mass effect or brain edema. No definite hemorrhage is evident in this location. The ventricles and sulci are within normal limits for age. Scattered small foci of T2 hyperintensity in the cerebral white matter and pons are nonspecific but compatible with mild chronic small vessel ischemic disease. Vascular: Major intracranial vascular flow voids are preserved. Skull and upper cervical spine: Unremarkable bone marrow signal. Sinuses/Orbits: Unremarkable orbits. Clear paranasal sinuses. No  significant mastoid fluid. Other: None. MRI CERVICAL SPINE FINDINGS Alignment: Cervical spine straightening.  No listhesis. Vertebrae: No fracture, suspicious osseous lesion, or significant marrow edema. Mild chronic degenerative endplate changes at P7-1 associated with moderate to severe disc space narrowing. Moderate disc space narrowing at C7-T1 and mild narrowing at C3-4 and C4-5. Mild diffuse spinal canal narrowing on a congenital basis. Cord: Normal signal. Posterior Fossa, vertebral arteries, paraspinal tissues: Unremarkable. Disc levels: C2-3: Mild left uncovertebral spurring without significant stenosis. C3-4: Disc bulging, left greater than right uncovertebral spurring, and moderate left facet arthrosis result in mild spinal stenosis and moderate to severe left neural foraminal stenosis. C4-5: Broad right paracentral disc protrusion and uncovertebral spurring result in mild spinal stenosis and mild bilateral neural foraminal stenosis. C5-6: Disc bulging results in moderate spinal stenosis without neural foraminal stenosis. C6-7: A broad central disc protrusion results in moderate spinal stenosis with slight cord flattening. Uncovertebral spurring results in borderline bilateral neural foraminal stenosis. C7-T1: Right greater than left uncovertebral spurring without significant stenosis. T1-2: Only imaged sagittally. Disc bulging/bilateral foraminal disc protrusions result in mild-to-moderate bilateral neural foraminal stenosis without spinal stenosis. IMPRESSION: 1. No acute intracranial abnormality. 2. 7 mm extra-axial nodule along the floor of the left anterior cranial fossa suspicious for a small meningioma. Nonemergent postcontrast brain MRI is recommended for further evaluation. 3. Mild chronic small vessel ischemic disease. 4. Cervical disc degeneration greatest at C6-7 where there is moderate spinal stenosis with slight cord flattening. 5. Moderate spinal stenosis at C5-6 and mild spinal stenosis at  C3-4 and C4-5. 6. Moderate to severe left neural foraminal stenosis at C3-4. Electronically Signed   By: Logan Bores M.D.   On: 12/27/2017 19:03   Mr Chest Wo Contrast  Result Date: 12/29/2017 CLINICAL DATA:  Constant neck pain radiating down the right arm into the first 3 fingers of the right hand. Right hand weakness. History of metastatic carcinoid. EXAM: MRI BRACHIAL PLEXUS WITHOUT CONTRAST TECHNIQUE: Multiplanar, multiecho pulse sequences of the neck and surrounding structures were obtained without intravenous contrast. The field of view was focused on the right brachial plexus from the neural foramina to the axilla. COMPARISON:  MRI cervical spine dated December 27, 2017. FINDINGS: Spinal cord Normal caliber and signal of visualized spinal cord. Brachial plexus Roots: Normal. Trunks: Normal. Divisions: Normal. Cords: Normal. Branches: Normal. Muscles and tendons Normal. Bones Cervical spine: Multilevel degenerative changes of the cervical better evaluated on recent cervical spine MRI. C7 transverse processes: Normal. Marrow signal: Normal. 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 IMPRESSION: 1. Normal MRI of the right brachial plexus. Electronically Signed   By: Titus Dubin M.D.   On: 12/29/2017 11:38   Mr Cervical Spine Wo Contrast  Result Date: 12/27/2017 CLINICAL DATA:  Right  hand weakness, headache, and dizziness. Fall. Neck and right shoulder pain. EXAM: MRI HEAD WITHOUT CONTRAST MRI CERVICAL SPINE WITHOUT CONTRAST TECHNIQUE: Multiplanar, multiecho pulse sequences of the brain and surrounding structures, and cervical spine, to include the craniocervical junction and cervicothoracic junction, were obtained without intravenous contrast. COMPARISON:  Head CT 12/27/2017 FINDINGS: MRI HEAD FINDINGS Brain: There is no evidence of acute infarct, intracranial hemorrhage, midline shift, or extra-axial fluid collection. There appears to be a round 7 mm T1 hypointense  extra-axial nodule inferior to the left frontal lobe in the anterior cranial fossa likely corresponding to the subtle density on CT without significant associated mass effect or brain edema. No definite hemorrhage is evident in this location. The ventricles and sulci are within normal limits for age. Scattered small foci of T2 hyperintensity in the cerebral white matter and pons are nonspecific but compatible with mild chronic small vessel ischemic disease. Vascular: Major intracranial vascular flow voids are preserved. Skull and upper cervical spine: Unremarkable bone marrow signal. Sinuses/Orbits: Unremarkable orbits. Clear paranasal sinuses. No significant mastoid fluid. Other: None. MRI CERVICAL SPINE FINDINGS Alignment: Cervical spine straightening.  No listhesis. Vertebrae: No fracture, suspicious osseous lesion, or significant marrow edema. Mild chronic degenerative endplate changes at Z0-0 associated with moderate to severe disc space narrowing. Moderate disc space narrowing at C7-T1 and mild narrowing at C3-4 and C4-5. Mild diffuse spinal canal narrowing on a congenital basis. Cord: Normal signal. Posterior Fossa, vertebral arteries, paraspinal tissues: Unremarkable. Disc levels: C2-3: Mild left uncovertebral spurring without significant stenosis. C3-4: Disc bulging, left greater than right uncovertebral spurring, and moderate left facet arthrosis result in mild spinal stenosis and moderate to severe left neural foraminal stenosis. C4-5: Broad right paracentral disc protrusion and uncovertebral spurring result in mild spinal stenosis and mild bilateral neural foraminal stenosis. C5-6: Disc bulging results in moderate spinal stenosis without neural foraminal stenosis. C6-7: A broad central disc protrusion results in moderate spinal stenosis with slight cord flattening. Uncovertebral spurring results in borderline bilateral neural foraminal stenosis. C7-T1: Right greater than left uncovertebral spurring  without significant stenosis. T1-2: Only imaged sagittally. Disc bulging/bilateral foraminal disc protrusions result in mild-to-moderate bilateral neural foraminal stenosis without spinal stenosis. IMPRESSION: 1. No acute intracranial abnormality. 2. 7 mm extra-axial nodule along the floor of the left anterior cranial fossa suspicious for a small meningioma. Nonemergent postcontrast brain MRI is recommended for further evaluation. 3. Mild chronic small vessel ischemic disease. 4. Cervical disc degeneration greatest at C6-7 where there is moderate spinal stenosis with slight cord flattening. 5. Moderate spinal stenosis at C5-6 and mild spinal stenosis at C3-4 and C4-5. 6. Moderate to severe left neural foraminal stenosis at C3-4. Electronically Signed   By: Logan Bores M.D.   On: 12/27/2017 19:03     Subjective: Patient seen and examined at bedside.  He still complains of intermittent shoulder blade pain on the right side and right wrist weakness.  Still cannot move his fingers properly.  No overnight fever or vomiting  Discharge Exam: Vitals:   12/29/17 2056 12/30/17 0522  BP: 119/60 135/80  Pulse: 66 70  Resp: 18 18  Temp: 98 F (36.7 C) 98.2 F (36.8 C)  SpO2: 95% 97%   Vitals:   12/29/17 1020 12/29/17 1504 12/29/17 2056 12/30/17 0522  BP: 120/62 116/71 119/60 135/80  Pulse: 66 62 66 70  Resp:  16 18 18   Temp:  98.1 F (36.7 C) 98 F (36.7 C) 98.2 F (36.8 C)  TempSrc:  Oral Oral Oral  SpO2:  96% 95% 97%  Weight:      Height:        General: Pt is alert, awake, not in acute distress Cardiovascular: rate controlled, S1/S2 + Respiratory: bilateral decreased breath sounds at bases Abdominal: Soft, NT, ND, bowel sounds + Extremities: no edema, no cyanosis CNS: Still not able to make fist in the right hand.  Able to raise right arm over shoulder.  Alert and awake    The results of significant diagnostics from this hospitalization (including imaging, microbiology, ancillary and  laboratory) are listed below for reference.     Microbiology: No results found for this or any previous visit (from the past 240 hour(s)).   Labs: BNP (last 3 results) No results for input(s): BNP in the last 8760 hours. Basic Metabolic Panel: Recent Labs  Lab 12/27/17 1510 12/28/17 0323 12/30/17 0339  NA 133* 132* 133*  K 3.6 4.0 4.4  CL 98 99 101  CO2 25 22 22   GLUCOSE 155* 231* 202*  BUN 14 16 31*  CREATININE 1.17 1.16 1.31*  CALCIUM 9.4 9.2 9.0  MG  --   --  2.2   Liver Function Tests: Recent Labs  Lab 12/27/17 1510  AST 32  ALT 31  ALKPHOS 86  BILITOT 1.0  PROT 6.9  ALBUMIN 4.0   No results for input(s): LIPASE, AMYLASE in the last 168 hours. No results for input(s): AMMONIA in the last 168 hours. CBC: Recent Labs  Lab 12/27/17 1510 12/30/17 0339  WBC 8.3 18.8*  NEUTROABS 6.4 17.4*  HGB 17.5* 16.9  HCT 51.5 49.3  MCV 92.5 91.3  PLT 96* 127*   Cardiac Enzymes: No results for input(s): CKTOTAL, CKMB, CKMBINDEX, TROPONINI in the last 168 hours. BNP: Invalid input(s): POCBNP CBG: Recent Labs  Lab 12/29/17 1312 12/29/17 1744 12/29/17 2056 12/30/17 0836 12/30/17 1253  GLUCAP 203* 204* 258* 199* 220*   D-Dimer No results for input(s): DDIMER in the last 72 hours. Hgb A1c Recent Labs    12/30/17 0339  HGBA1C 6.0*   Lipid Profile No results for input(s): CHOL, HDL, LDLCALC, TRIG, CHOLHDL, LDLDIRECT in the last 72 hours. Thyroid function studies No results for input(s): TSH, T4TOTAL, T3FREE, THYROIDAB in the last 72 hours.  Invalid input(s): FREET3 Anemia work up No results for input(s): VITAMINB12, FOLATE, FERRITIN, TIBC, IRON, RETICCTPCT in the last 72 hours. Urinalysis No results found for: COLORURINE, APPEARANCEUR, LABSPEC, [place], GLUCOSEU, HGBUR, BILIRUBINUR, KETONESUR, PROTEINUR, UROBILINOGEN, NITRITE, LEUKOCYTESUR Sepsis Labs Invalid input(s): PROCALCITONIN,  WBC,  LACTICIDVEN Microbiology No results found for this or any  previous visit (from the past 240 hour(s)).   Time coordinating discharge: 35 minutes  SIGNED:   Aline August, MD  [facility] 12/30/2017, 2:37 PM Pager: [phone]  If 7PM-7AM, please contact night-coverage [email or web address] Password [email or web address]

## 2017-12-30 NOTE — Evaluation (Signed)
Physical Therapy Evaluation Patient Details Name: Gregory Cook MRN: [id number] DOB: 1951/03/14 Today's Date: 12/30/2017   History of Present Illness  Pt is a 67 y/o male admitted secondary to sudden onset R hand weakness. MRI of head negative for acute abnormality. IMaging of C spine showed stenosis. MRI of chest negative for brachial plexus imaging. Per MD notes plan to follow up with outpatient EMG. 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   Clinical Impression  Pt admitted secondary to problem above with deficits below. Pt reporting some wooziness during gait, however, was overall steady requiring supervision with use of RW. Pt reported increased comfort with use of RW, and pt reports he has one at home. Pt with increased pain in R scapular region down R arm, described as sharp pain. Noted increased pain when palpating R parascapular musculature and when pt performed scapular squeeze. Pt also with weakness in R finger flexors and extensors. Feel pt would benefit from outpatient OT at d/c, however, anticipate pt will progress well and will not need follow up PT. Will continue to follow acutely to maximize functional mobility independence and safety.     Follow Up Recommendations Other (comment)(outpatient OT )    Equipment Recommendations  None recommended by PT    Recommendations for Other Services OT consult     Precautions / Restrictions Precautions Precautions: Fall Precaution Comments: Pt reports unexplained fall right before admission. Does not recall what happened, but reports he does not feel like he passed out.  Restrictions Weight Bearing Restrictions: No      Mobility  Bed Mobility               General bed mobility comments: Sitting in chair upon entry.   Transfers Overall transfer level: Needs assistance Equipment used: None Transfers: Sit to/from Stand Sit to Stand: Supervision         General transfer comment:  Slight unsteadiness noted to stand, however, no physical assist required.   Ambulation/Gait Ambulation/Gait assistance: Supervision Gait Distance (Feet): 50 Feet Assistive device: Rolling walker (2 wheeled);None Gait Pattern/deviations: Step-through pattern;Decreased stride length Gait velocity: Decreased    General Gait Details: Slow, very cautious gait. Attempted gait without RW, however, pt reported increased comfort with RW, so RW used throughout rest of mobility. Some "wooziness" reported, however, pt reports likely because of pain medications. No LOB noted.   Stairs            Wheelchair Mobility    Modified Rankin (Stroke Patients Only)       Balance Overall balance assessment: Needs assistance Sitting-balance support: No upper extremity supported;Feet supported Sitting balance-Leahy Scale: Good     Standing balance support: No upper extremity supported;During functional activity;Bilateral upper extremity supported Standing balance-Leahy Scale: Fair Standing balance comment: Able to maintain static standing without UE support                              Pertinent Vitals/Pain Pain Assessment: Faces Faces Pain Scale: Hurts even more Pain Location: R shoulder blade into arm Pain Descriptors / Indicators: Grimacing;Shooting;Sharp Pain Intervention(s): Monitored during session;Limited activity within patient's tolerance;Repositioned    Home Living Family/patient expects to be discharged to:: Private residence Living Arrangements: Spouse/significant other Available Help at Discharge: Family;Available 24 hours/day Type of Home: House Home Access: Stairs to enter Entrance Stairs-Rails: None Entrance Stairs-Number of Steps: 3 Home Layout: One level Home Equipment: Walker - 2  wheels      Prior Function Level of Independence: Independent         Comments: 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 Unable to form a fist. Was able to extend fingers. PROM WFL. ALso reports numbness into fingers.  RUE Sensation: decreased light touch RUE Coordination: decreased fine motor    Lower Extremity Assessment Lower Extremity Assessment: Overall WFL for tasks assessed    Cervical / Trunk Assessment Cervical / Trunk Assessment: Normal  Communication   Communication: No difficulties  Cognition Arousal/Alertness: Awake/alert Behavior During Therapy: WFL for tasks assessed/performed Overall Cognitive Status: Within Functional Limits for tasks assessed                                        General Comments General comments (skin integrity, edema, etc.): Palpated in pt's R rhomboid musculature which elicited pain. Performing scapular squeeze also caused increase in pain. Pt's wife present during session. Educated about follow up recommendations.     Exercises Other Exercises Other Exercises: Performed PROM to R fingers into flexion and extension X2-3 each finger. Educated to perform at home to maintain ROM.  Other Exercises: Educated about AROM exercises for finger (finger flexion and extension).    Assessment/Plan    PT Assessment Patient needs continued PT services  PT Problem List Decreased mobility;Decreased strength;Impaired sensation;Decreased activity tolerance       PT Treatment Interventions Gait training;Stair training;Functional mobility training;Therapeutic activities;Therapeutic exercise;Balance training;Patient/family education    PT Goals (Current goals can be found in the Care Plan section)  Acute Rehab PT Goals Patient Stated Goal: "to figure out what is going on with my hand"  PT Goal Formulation: With patient Time For Goal Achievement: 01/13/18 Potential to Achieve Goals: Good     Frequency Min 3X/week   Barriers to discharge        Co-evaluation               AM-PAC PT "6 Clicks" Daily Activity  Outcome Measure Difficulty turning over in bed (including adjusting bedclothes, sheets and blankets)?: A Little Difficulty moving from lying on back to sitting on the side of the bed? : A Little Difficulty sitting down on and standing up from a chair with arms (e.g., wheelchair, bedside commode, etc,.)?: A Little Help needed moving to and from a bed to chair (including a wheelchair)?: A Little Help needed walking in hospital room?: A Little Help needed climbing 3-5 steps with a railing? : A Little 6 Click Score: 18    End of Session Equipment Utilized During Treatment: Gait belt Activity Tolerance: Patient tolerated treatment well Patient left: in bed;with call bell/phone within reach;with family/visitor present(sitting EOB ) Nurse Communication: Mobility status;Other (comment)(need outpatient OT ) PT Visit Diagnosis: Other abnormalities of gait and mobility (R26.89);Unsteadiness on feet (R26.81);Muscle weakness (generalized) (M62.81)    Time: 1324-4010 PT Time Calculation (min) (ACUTE ONLY): 24 min   Charges:   PT Evaluation $PT Eval Low Complexity: 1 Low PT Treatments $Gait Training: 8-22 mins        Leighton Ruff, PT, DPT  Acute Rehabilitation Services  Pager: [phone] Office: [phone]   Rudean Hitt 12/30/2017, 5:43 PM

## 2017-12-30 NOTE — Care Management Note (Signed)
Case Management Note  Patient Details  Name: [name] MRN: [id number] Date of Birth: Nov 01, 1950  Subjective/Objective:                 R hand weakness   Action/Plan:  Independent patient from home w wife. [place]. Spoke w at bedside, states that he is using walker for continued balance issues, and demonstrates that he cannot make a fist with R hand. CM concerned about his ability to use walker properly. Initially CM suggesting OP OT, however after speaking with patient, discussed with Dr Starla Link obtaining PT OT orders prior to DC.   Will follow for PT OT recs and potential DME needs.  Patient visibly upset and nervous re physical limitations, emotional support provided.    Expected Discharge Date:                  Expected Discharge Plan:  Home/Self Care  In-House Referral:     Discharge planning Services  NA  Post Acute Care Choice:  NA Choice offered to:     DME Arranged:    DME Agency:     HH Arranged:    HH Agency:     Status of Service:  In process, will continue to follow  If discussed at Long Length of Stay Meetings, dates discussed:    Additional Comments:  Carles Collet, RN 12/30/2017, 11:20 AM

## 2017-12-30 NOTE — Progress Notes (Signed)
Inpatient Diabetes Program Recommendations  AACE/ADA: New Consensus Statement on Inpatient Glycemic Control (2019)  Target Ranges:  Prepandial:   less than 140 mg/dL      Peak postprandial:   less than 180 mg/dL (1-2 hours)      Critically ill patients:  140 - 180 mg/dL   Results for ARIQ, KHAMIS (MRN [id number]) as of 12/30/2017 13:11  Ref. Range 12/29/2017 08:47 12/29/2017 13:12 12/29/2017 17:44 12/29/2017 20:56 12/30/2017 08:36 12/30/2017 12:53  Glucose-Capillary Latest Ref Range: 70 - 99 mg/dL 201 (H) 203 (H) 204 (H) 258 (H) 199 (H) 220 (H)   Review of Glycemic Control  Diabetes history: DM2 Outpatient Diabetes medications: Metformin XR 500 mg QPM Current orders for Inpatient glycemic control: Novolog 0-15 units TID with meals; Decadron 6 mg Q6H  Inpatient Diabetes Program Recommendations:  Correction (SSI): Please consider ordering Novolog 0-5 units QHS for bedtime correction scale. Insulin - Meal Coverage: If steroids are continued, please consider ordering Novolog 2 units TID with meals for meal coverage if patient eats at least 50% of meals.  Thanks, Barnie Alderman, RN, MSN, CDE Diabetes Coordinator Inpatient Diabetes Program [phone] (Team Pager from 8am to 5pm)

## 2017-12-30 NOTE — Progress Notes (Signed)
Patient given discharge instructions and verbalized understanding. Patient aware of outpatient therapy follow up. Patient left unit in stable condition via wheelchair with nursing staff.

## 2018-01-12 ENCOUNTER — Ambulatory Visit: Payer: Medicare Other | Admitting: Physical Therapy

## 2020-05-04 IMAGING — PT NM PET NOPR SKULL BASE TO THIGH
1 of 8 series · 1 of 25 positions shown · non-contrast
Comparison: DOTATATE PET 02/05/2017

CLINICAL DATA: Well differentiated neuroendocrine tumor with liver
metastasis, lung metastasis, and central mesenteric mass along the
peritoneal metastasis. Patient status post 4 treatments (complete
cycle) Lu 177 DOTATATE peptide receptor radiotherapy ([place] [facility]. Last
treatment 09/23/2017

EXAM:
NUCLEAR MEDICINE PET SKULL BASE TO THIGH
TECHNIQUE: 4.9 mCi Ga 68 DOTATATE was injected intravenously. Full-ring PET
imaging was performed from the skull base to thigh after the
radiotracer. CT data was obtained and used for attenuation
correction and anatomic localization.

[Series 4: ct sk_thigh 5.0 b31f · axial · 5.0mm · 0.98mm/px · 1 of 246 slices shown]
[im 246/246  brain]
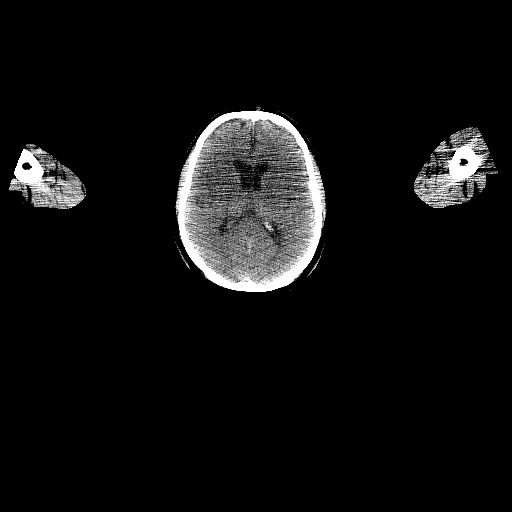

[1 of 25 positions shown; findings below may reference images not displayed]

FINDINGS: NECK

No radiotracer activity in neck lymph nodes.

There is a focus of activity localizing to the inferior medial LEFT
frontal lobe along the sphenoid bone with intense radiotracer
activity (SUV max 26.9). This is focal activity unchanged from prior
with SUV max equal 28 6. This is favored a benign lesion such as
meningioma. A solitary skeletal metastasis would be a secondary
consideration.

Incidental CT findings: None

CHEST

Interval decrease in size and radiotracer activity of the RIGHT
upper lobe metastatic nodule which measures 10 mm by 7 mm decreased
from 14 mm by 11 mm. Lesion is decreased significantly in
radiotracer activity with SUV max equal 3.4 decreased from 21.8. No
additional pulmonary nodules are present. No abnormal or radiotracer
avid mediastinal lymph nodes.

Incidental CT finding:None

ABDOMEN/PELVIS

No change in the number of radiotracer avid hepatic lesions. Three
adjacent radiotracer avid lesions in the RIGHT hepatic lobe. A
single lesion in the subcapsular LEFT hepatic lobe. Potential
additional small lesion in or adjacent to the caudate lobe. These
metastatic hepatic lesions have radiotracer activity nearly
identical to pretreatment exam. For example posterior RIGHT hepatic
lobe lesion with SUV max equal 13.4 compares to 14.5. Adjacent
larger lesion with SUV max equal 18.9 compared to 14.4. Lesion
anterior LEFT hepatic lobe with SUV max equal 10.1 compared to 11.6.

As internal reference value, background activity liver (SUV max
equal 5.6 compareds to 7.0 on comparison).

The central mesenteric mass is unchanged in imaging characteristics
and similar and radiotracer activity SUV max equal 32 compared SUV
max equal 33.

Deep peritoneal nodular implant along the RIGHT seminal vesicles
with SUV max equal 19.9 compared to 19. 2.

No new or progressive metastatic neuroendocrine tumor in the abdomen
pelvis.

Physiologic activity noted in the liver, spleen, adrenal glands and
kidneys.

Incidental CT findings:None

SKELETON

No focal activity to suggest skeletal metastasis.

Incidental CT findings:None
IMPRESSION: 1. Essentially stable disease in the in the liver, central
mesenteric mass and deep pelvic mesenteric nodal implant with
minimal change in radiotracer activity.
2. Marked reduction in size and radiotracer activity the RIGHT
pulmonary nodule metastasis.
3. Of note, peptide receptor radiotherapy has been reported to have
continued positive toxic tumor effect for 6 months following
completion of 1uNNN DOTATATE peptide radiotherapy. Consider
follow-up DOTATATE PET scan in 6 months.
4. Focal activity in the anterior inferior LEFT frontal lobe
adjacent to the sphenoid bone is favored a benign meningioma over a
solitary bone metastasis or brain metastasis. This could be
confirmed with MRI of the brain but not necessarily recommended.

## 2022-02-12 ENCOUNTER — Other Ambulatory Visit (HOSPITAL_COMMUNITY): Payer: Self-pay | Admitting: Hematology and Oncology

## 2022-02-12 DIAGNOSIS — C7B09 Secondary carcinoid tumors of other sites: Secondary | ICD-10-CM

## 2022-02-19 ENCOUNTER — Ambulatory Visit (HOSPITAL_COMMUNITY)
Admission: RE | Admit: 2022-02-19 | Discharge: 2022-02-19 | Disposition: A | Discharge status: Home / Self Care | Payer: Medicare Other | Source: Ambulatory Visit | Attending: Hematology and Oncology | Admitting: Hematology and Oncology

## 2022-02-19 DIAGNOSIS — C7B09 Secondary carcinoid tumors of other sites: Secondary | ICD-10-CM | POA: Insufficient documentation

## 2022-02-19 NOTE — Consult Note (Signed)
Chief Complaint: Patient with metastatic neuroendocrine tumor was for  evaluation Peptide receptor radiotherapy (PRRT) with GE952 DOTATATE (Lutathera).  Referring Physician(s):Sanders    Patient Status: [facility] - Out-pt  History of Present Illness: Gregory Cook is a 71 y.o. male   Initial diagnosis 2014 metastatic neuroendocrine tumor.  Patient status post bland embolization initiation of Sandostatin injections.     Patient underwent 4 cycles peptide receptor radiotherapy (200 mCi Lu 177 Dotatate.)  Last  doses with last dose 09/23/2017.     Patient had a long period of positive response to peptide receptor radiotherapy.     Most recent DOTATATE PET scan demonstrate several new small liver lesions and enlarged periportal radiotracer avid metastatic lymph node.  Patient's chromogranin is moderately elevated 380.     Patient presents for re-treatment with peptide receptor radiotherapy (Lu 177 Dotatate).     Past Medical History:  Diagnosis Date   DM2 (diabetes mellitus, type 2) ([place])    Hypertension    Liver metastasis ([place])    Metastatic carcinoid tumor ([place])    OSA (obstructive sleep apnea)    Stroke [name])     Past Surgical History:  Procedure Laterality Date   CHOLECYSTECTOMY     IR GENERIC HISTORICAL  05/07/2016   IR RADIOLOGIST EVAL & MGMT 05/07/2016 [name], MD [facility] INTERV RAD   IR RADIOLOGIST EVAL & MGMT  10/29/2017    Allergies: Nuvigil [armodafinil]  Medications: Prior to Admission medications   Medication Sig Start Date End Date Taking? Authorizing Provider  acetaminophen (TYLENOL) 500 MG tablet Take 500 mg by mouth 2 (two) times daily.     [provider]  aspirin EC 81 MG tablet Take 81 mg by mouth at bedtime.    [provider]  atenolol (TENORMIN) 50 MG tablet Take 50 mg by mouth daily.    [provider]  butalbital-acetaminophen-caffeine (FIORICET, ESGIC) 50-325-40 MG per tablet Take 1 tablet by mouth every  4 (four) hours as needed for headache.    [provider]  cholestyramine light (PREVALITE) 4 GM/DOSE powder Take 4 g by mouth daily after breakfast.     [provider]  diclofenac sodium (VOLTAREN) 1 % GEL Apply 1 application topically 3 (three) times daily as needed (pain).     [provider]  diltiazem (DILACOR XR) 120 MG 24 hr capsule Take 120 mg by mouth at bedtime.     [provider]  diphenoxylate-atropine (LOMOTIL) 2.5-0.025 MG per tablet Take 1 tablet by mouth daily.     [provider]  famotidine-calcium carbonate-magnesium hydroxide (PEPCID COMPLETE) 10-800-165 MG chewable tablet Chew 1 tablet by mouth at bedtime.    [provider]  gabapentin (NEURONTIN) 300 MG capsule Take 1 capsule (300 mg total) by mouth 3 (three) times daily. 12/30/17   Aline August, MD  metFORMIN (GLUCOPHAGE-XR) 500 MG 24 hr tablet Take 500 mg by mouth daily after supper.  12/24/17   [provider]  methylPREDNISolone (MEDROL DOSEPAK) 4 MG TBPK tablet Take by mouth as directed. Take as directed in the dose pack 12/30/17   Aline August, MD  octreotide (SANDOSTATIN LAR) 30 MG injection Inject 60 mg into the muscle See admin instructions. Inject two doses (60 mg) intramuscularly (one injection in each butt cheek) - once every 28 days - last injection 12/23/17 - administered by [facility])    [provider]  oxyCODONE (ROXICODONE) 5 MG immediate release tablet Take 1 tablet (5  mg total) by mouth every 6 (six) hours as needed for severe pain. 12/30/17   Aline August, MD  pravastatin (PRAVACHOL) 20 MG tablet Take 20 mg by mouth at bedtime.  12/24/17   [provider]  rOPINIRole (REQUIP) 1 MG tablet Take 1 mg by mouth at bedtime.    [provider]  sildenafil (REVATIO) 20 MG tablet Take 40-100 mg by mouth See admin instructions. Take 2-5 tablets (40-100 mg) by mouth 30-60 minutes prior to sexual  activity    [provider]  terazosin (HYTRIN) 10 MG capsule Take 10 mg by mouth at bedtime.    [provider]  testosterone cypionate (DEPOTESTOSTERONE CYPIONATE) 200 MG/ML injection Inject 80 mg into the muscle every 14 (fourteen) days. For IM use only     [provider]  tiZANidine (ZANAFLEX) 2 MG tablet Take 2-6 mg by mouth every 8 (eight) hours as needed (shoulder pain).  12/26/17   [provider]  triamterene-hydrochlorothiazide (MAXZIDE) 75-50 MG per tablet Take 1 tablet by mouth daily.     [provider]     Family History  Problem Relation Age of Onset   Colon cancer Neg Hx     Social History   Socioeconomic History   Marital status: Married    Spouse name: Not on file   Number of children: Not on file   Years of education: Not on file   Highest education level: Not on file  Occupational History   Not on file  Tobacco Use   Smoking status: Never   Smokeless tobacco: Never  Substance and Sexual Activity   Alcohol use: No    Alcohol/week: 0.0 standard drinks of alcohol   Drug use: No   Sexual activity: Not on file  Other Topics Concern   Not on file  Social History Narrative   Not on file   Social Determinants of Health   Financial Resource Strain: Not on file  Food Insecurity: Not on file  Transportation Needs: Not on file  Physical Activity: Not on file  Stress: Not on file  Social Connections: Not on file    ECOG Status: 1 - Symptomatic but completely ambulatory  Review of Systems: A 12 point ROS discussed and pertinent positives are indicated in the HPI above.  All other systems are negative.  Review of Systems  Gastrointestinal:  Positive for abdominal distention, abdominal pain and diarrhea.  Musculoskeletal:        Loss of right arm use.  No clear etiology  Skin: Negative.        No flushing    Vital Signs: There were no vitals taken for this visit.  Physical Exam HENT:     Head:  Normocephalic.  Cardiovascular:     Rate and Rhythm: Normal rate.  Pulmonary:     Effort: Pulmonary effort is normal.  Abdominal:     Tenderness: There is abdominal tenderness. There is no guarding.  Skin:    General: Skin is dry.     Capillary Refill: Capillary refill takes less than 2 seconds.     Coloration: Skin is pale.  Neurological:     Mental Status: He is alert.     Imaging: No results found.  Labs:  CBC:  Ref Range & Units 3 wk ago   WBC 4.4 - 11.0 x 10*3/uL 8.5  RBC 4.50 - 5.90 x 10*6/uL 5.67  Hemoglobin 14.0 - 17.5 G/DL 14.8  Hematocrit 41.5 - 50.4 % 44.2  MCV 80.0 -  96.0 FL 78.0 Low   MCH 27.5 - 33.2 PG 26.2 Low   MCHC 33.0 - 37.0 G/DL 33.5  RDW 12.3 - 17.0 % 16.9  Platelets 150 - 450 X 10*3/uL 138 Low     COAGS: No results for input(s): "INR", "APTT" in the last 8760 hours.  BMP:  Ref Range & Units 4 wk ago  Sodium 135 - 146 MMOL/L 137  Potassium 3.5 - 5.3 MMOL/L 3.5  Chloride 98 - 110 MMOL/L 101  CO2 23 - 30 MMOL/L 22 Low   BUN 8 - 24 MG/DL 13  Glucose 70 - 99 MG/DL 207 High   Comment: Patients taking eltrombopag at doses >/= 100 mg daily may show falsely elevated values of 10% or greater.  Creatinine 0.50 - 1.50 MG/DL 0.89  Calcium 8.5 - 10.5 MG/DL 9.1  Total Protein 6.0 - 8.3 G/DL 6.4  Comment: Patients taking eltrombopag at doses >/= 100 mg daily may show falsely elevated values of 10% or greater.  Albumin 3.5 - 5.0 G/DL 3.7  Total Bilirubin 0.1 - 1.2 MG/DL 0.6  Comment: Patients taking eltrombopag at doses >/= 100 mg daily may show falsely elevated values of 10% or greater.  Alkaline Phosphatase 25 - 125 IU/L or U/L 106  AST (SGOT) 5 - 40 IU/L or U/L 38  ALT (SGPT) 5 - 50 IU/L or U/L 29  Anion Gap 4 - 14 MMOL/L 14  Est. GFR >=60 ML/MIN/1.73 M*2 90    LIVER FUNCTION TESTS: No results for input(s): "BILITOT", "AST", "ALT", "ALKPHOS", "PROT", "ALBUMIN" in the last 8760 hours.  TUMOR MARKERS: Chromganin A = 350  Assessment and  Plan:  Progression of well differentiated tumor following a positive response to peptide receptor radiotherapy (2019).  Elevated chromogranin.  Moderate diarrhea.   Patient presents for re-treatment of well differentiated neuroendocrine tumor with Lu -[place]son.   Patient CBC, liver function and renal function are good.    Patient will return in approximately 6 weeks for initial cycle of 2 cyclesLu-177 DOTATATE.  Risk and benefits of procedure explained.  Primary risk being myelosuppression renal toxicity.   Patient will received Sandostatin injection following initial therapy.  Patient will follow-up with oncologist in 4 weeks after initial therapy for laboratory assessment and Sandostatin injection.  Second and final treatment 8 weeks after first treatment.    Thank you for this interesting consult.  I greatly enjoyed meeting Kdyn Vonbehren and look forward to participating in their care.  A copy of this report was sent to the requesting provider on this date.  Electronically Signed: Rennis Golden, MD 02/19/2022, 4:03 PM   I spent a total of    15 Minutes in face to face in clinical consultation, greater than 50% of which was counseling/coordinating care for metastatic neuroendocrine tumor.

## 2022-02-20 ENCOUNTER — Other Ambulatory Visit (HOSPITAL_COMMUNITY): Payer: Self-pay | Admitting: Hematology and Oncology

## 2022-02-20 ENCOUNTER — Encounter (HOSPITAL_COMMUNITY): Payer: Self-pay | Admitting: Hematology and Oncology

## 2022-02-20 DIAGNOSIS — C7A8 Other malignant neuroendocrine tumors: Secondary | ICD-10-CM

## 2022-04-07 NOTE — Written Directive (Cosign Needed)
MOLECULAR IMAGING AND THERAPEUTICS WRITTEN DIRECTIVE   PATIENT NAME: Gregory Cook  PT DOB:   1950/05/25                                              MRN: [id number]  ---------------------------------------------------------------------------------------------------------------------  [facility] THERAP[facility]   RADIOPHARMACEUTICAL:  Lutetium 177 Dotatate (Lutathera)     PRESCRIBED DOSE FOR ADMINISTRATION:  200 mCi   ROUTE OFADMINISTRATION:  IV   DIAGNOSIS:  neuroendocrine carcinoma   REFERRING PHYSICIAN: Kathrynn Running. Baird Cancer, MD   TREATMENT #: Ist retreatment  (already completed 4 Lu-177 tx's in 2019)    DATE OF LAST LONG LIVED SOMATOSTATIN INJECTION:   ADDITIONAL PHYSICIAN COMMENTS/NOTES:   AUTHORIZED USER SIGNATURE & TIME STAMP: Rennis Golden, MD   04/08/22    9:24 AM

## 2022-04-09 ENCOUNTER — Ambulatory Visit (HOSPITAL_COMMUNITY)
Admission: RE | Admit: 2022-04-09 | Discharge: 2022-04-09 | Disposition: A | Discharge status: Home / Self Care | Payer: Medicare Other | Source: Ambulatory Visit | Attending: Hematology and Oncology | Admitting: Hematology and Oncology

## 2022-04-09 ENCOUNTER — Other Ambulatory Visit (HOSPITAL_COMMUNITY): Payer: Self-pay | Admitting: Hematology and Oncology

## 2022-04-09 VITALS — BP 126/74 | HR 64 | Resp 20

## 2022-04-09 DIAGNOSIS — C7B02 Secondary carcinoid tumors of liver: Secondary | ICD-10-CM | POA: Insufficient documentation

## 2022-04-09 DIAGNOSIS — C7A8 Other malignant neuroendocrine tumors: Secondary | ICD-10-CM

## 2022-04-09 LAB — CBC WITH DIFFERENTIAL/PLATELET
Abs Immature Granulocytes: 0.03 10*3/uL (ref 0.00–0.07)
Basophils Absolute: 0 10*3/uL (ref 0.0–0.1)
Basophils Relative: 0 %
Eosinophils Absolute: 0.1 10*3/uL (ref 0.0–0.5)
Eosinophils Relative: 1 %
HCT: 42.8 % (ref 39.0–52.0)
Hemoglobin: 13.6 g/dL (ref 13.0–17.0)
Immature Granulocytes: 0 %
Lymphocytes Relative: 31 %
Lymphs Abs: 2.8 10*3/uL (ref 0.7–4.0)
MCH: 26.3 pg (ref 26.0–34.0)
MCHC: 31.8 g/dL (ref 30.0–36.0)
MCV: 82.8 fL (ref 80.0–100.0)
Monocytes Absolute: 1 10*3/uL (ref 0.1–1.0)
Monocytes Relative: 11 %
Neutro Abs: 5.1 10*3/uL (ref 1.7–7.7)
Neutrophils Relative %: 57 %
Platelets: 132 10*3/uL — ABNORMAL LOW (ref 150–400)
RBC: 5.17 MIL/uL (ref 4.22–5.81)
RDW: 17.7 % — ABNORMAL HIGH (ref 11.5–15.5)
WBC: 9 10*3/uL (ref 4.0–10.5)
nRBC: 0 % (ref 0.0–0.2)

## 2022-04-09 LAB — COMPREHENSIVE METABOLIC PANEL
ALT: 30 U/L (ref 0–44)
AST: 44 U/L — ABNORMAL HIGH (ref 15–41)
Albumin: 3.9 g/dL (ref 3.5–5.0)
Alkaline Phosphatase: 67 U/L (ref 38–126)
Anion gap: 9 (ref 5–15)
BUN: 22 mg/dL (ref 8–23)
CO2: 22 mmol/L (ref 22–32)
Calcium: 8.9 mg/dL (ref 8.9–10.3)
Chloride: 109 mmol/L (ref 98–111)
Creatinine, Ser: 1.3 mg/dL — ABNORMAL HIGH (ref 0.61–1.24)
GFR, Estimated: 58 mL/min — ABNORMAL LOW (ref >60)
Glucose, Bld: 134 mg/dL — ABNORMAL HIGH (ref 70–99)
Potassium: 3.6 mmol/L (ref 3.5–5.1)
Sodium: 140 mmol/L (ref 135–145)
Total Bilirubin: 0.8 mg/dL (ref 0.3–1.2)
Total Protein: 6.8 g/dL (ref 6.5–8.1)

## 2022-04-09 MED ORDER — OCTREOTIDE ACETATE 30 MG IM KIT: 30.0000 mg | PACK | Freq: Once | INTRAMUSCULAR | Status: DC

## 2022-04-09 MED ORDER — AMINO ACID RADIOPROTECTANT - L-LYSINE 2.5%/L-ARGININE 2.5% IN NS
250.0000 mL/h | INTRAVENOUS | Status: AC
  Administered 2022-04-09: 250 mL/h via INTRAVENOUS
  Filled 2022-04-09: qty 1000

## 2022-04-09 MED ORDER — OCTREOTIDE ACETATE 500 MCG/ML IJ SOLN
INTRAMUSCULAR | Status: AC
  Filled 2022-04-09: qty 1

## 2022-04-09 MED ORDER — OCTREOTIDE ACETATE 30 MG IM KIT
60.0000 mg | PACK | Freq: Once | INTRAMUSCULAR | Status: AC
  Administered 2022-04-09: 60 mg via INTRAMUSCULAR

## 2022-04-09 MED ORDER — PROCHLORPERAZINE EDISYLATE 10 MG/2ML IJ SOLN: 10.0000 mg | Freq: Four times a day (QID) | INTRAMUSCULAR | Status: DC | PRN

## 2022-04-09 MED ORDER — SODIUM CHLORIDE 0.9 % IV SOLN
8.0000 mg | Freq: Once | INTRAVENOUS | Status: AC
  Administered 2022-04-09: 8 mg via INTRAVENOUS
  Filled 2022-04-09 (×2): qty 4

## 2022-04-09 MED ORDER — OCTREOTIDE ACETATE 30 MG IM KIT
PACK | INTRAMUSCULAR | Status: AC
  Filled 2022-04-09: qty 2

## 2022-04-09 MED ORDER — ONDANSETRON HCL 8 MG PO TABS: 8.0000 mg | ORAL_TABLET | Freq: Two times a day (BID) | ORAL | 0 refills | Status: AC | PRN

## 2022-04-09 MED ORDER — OCTREOTIDE ACETATE 500 MCG/ML IJ SOLN: 500.0000 ug | Freq: Once | INTRAMUSCULAR | Status: DC | PRN

## 2022-04-09 MED ORDER — SODIUM CHLORIDE 0.9 % IV SOLN
500.0000 mL | Freq: Once | INTRAVENOUS | Status: AC
  Administered 2022-04-09: 500 mL via INTRAVENOUS

## 2022-04-09 MED ORDER — LUTETIUM LU 177 DOTATATE 370 MBQ/ML IV SOLN
200.0000 | Freq: Once | INTRAVENOUS | Status: AC
  Administered 2022-04-09: 202.8 via INTRAVENOUS

## 2022-04-09 NOTE — Progress Notes (Addendum)
Lutathera Treatment infused at this time. Pt is alert, calm and pleasant and has no s/s of distress. VS and orders assessed and pt denies complaints. Wife is at the chairside and will continue to monitor and tx pt according to MD orders. Amino Acids infusion continues and pt is tolerating it well.

## 2022-06-03 NOTE — Written Directive (Addendum)
MOLECULAR IMAGING AND THERAPEUTICS WRITTEN DIRECTIVE   PATIENT NAME: Gregory Cook  PT DOB:   18-Feb-1951                                              MRN: [id number]  ---------------------------------------------------------------------------------------------------------------------  [facility]   RADIOPHARMACEUTICAL:  Lutetium 177 Dotatate (Lutathera)     PRESCRIBED DOSE FOR ADMINISTRATION:  200 mCi   ROUTE OFADMINISTRATION:  IV   DIAGNOSIS:  neuroendocrine carcinoma   REFERRING PHYSICIAN: Dr. Verner Chol   TREATMENT #: 2   DATE OF LAST LONG LIVED SOMATOSTATIN INJECTION: 04/09/2022   ADDITIONAL PHYSICIAN COMMENTS/NOTES:   AUTHORIZED USER SIGNATURE & TIME STAMP: Rennis Golden, MD   06/03/22    12:13 PM

## 2022-06-05 ENCOUNTER — Other Ambulatory Visit (HOSPITAL_COMMUNITY): Payer: Medicare Other

## 2022-06-05 ENCOUNTER — Encounter (HOSPITAL_COMMUNITY)
Admission: RE | Admit: 2022-06-05 | Discharge: 2022-06-05 | Disposition: A | Discharge status: Home / Self Care | Payer: No Typology Code available for payment source | Source: Ambulatory Visit | Attending: Hematology and Oncology | Admitting: Hematology and Oncology

## 2022-06-23 DEATH — deceased
